# Patient Record
Sex: Female | Born: 1960
Health system: Southern US, Community
[De-identification: ages and names within clinical notes are randomized; demographics above are authoritative.]

## PROBLEM LIST (undated history)

## (undated) ENCOUNTER — Emergency Department (HOSPITAL_BASED_OUTPATIENT_CLINIC_OR_DEPARTMENT_OTHER): Admission: EM | Payer: 59 | Source: Home / Self Care

## (undated) DIAGNOSIS — K635 Polyp of colon: Secondary | ICD-10-CM

## (undated) DIAGNOSIS — E209 Hypoparathyroidism, unspecified: Secondary | ICD-10-CM

## (undated) DIAGNOSIS — Z8619 Personal history of other infectious and parasitic diseases: Secondary | ICD-10-CM

## (undated) DIAGNOSIS — J302 Other seasonal allergic rhinitis: Secondary | ICD-10-CM

## (undated) HISTORY — DX: Personal history of other infectious and parasitic diseases: Z86.19

## (undated) HISTORY — DX: Other seasonal allergic rhinitis: J30.2

## (undated) HISTORY — DX: Polyp of colon: K63.5

---

## 1994-12-26 HISTORY — PX: TOE SURGERY: SHX1073

## 2001-10-16 ENCOUNTER — Other Ambulatory Visit: Admission: RE | Admit: 2001-10-16 | Discharge: 2001-10-16 | Payer: Self-pay | Admitting: Obstetrics and Gynecology

## 2003-01-24 ENCOUNTER — Other Ambulatory Visit: Admission: RE | Admit: 2003-01-24 | Discharge: 2003-01-24 | Payer: Self-pay | Admitting: Obstetrics and Gynecology

## 2004-02-18 ENCOUNTER — Other Ambulatory Visit: Admission: RE | Admit: 2004-02-18 | Discharge: 2004-02-18 | Payer: Self-pay | Admitting: Obstetrics and Gynecology

## 2005-09-20 ENCOUNTER — Other Ambulatory Visit: Admission: RE | Admit: 2005-09-20 | Discharge: 2005-09-20 | Payer: Self-pay | Admitting: Obstetrics and Gynecology

## 2006-09-26 ENCOUNTER — Other Ambulatory Visit: Admission: RE | Admit: 2006-09-26 | Discharge: 2006-09-26 | Payer: Self-pay | Admitting: Obstetrics & Gynecology

## 2014-09-24 ENCOUNTER — Encounter: Payer: Self-pay | Admitting: Sports Medicine

## 2014-09-24 ENCOUNTER — Ambulatory Visit (INDEPENDENT_AMBULATORY_CARE_PROVIDER_SITE_OTHER): Payer: BC Managed Care – PPO | Admitting: Sports Medicine

## 2014-09-24 VITALS — BP 122/80 | Ht 67.0 in | Wt 175.0 lb

## 2014-09-24 DIAGNOSIS — M79675 Pain in left toe(s): Secondary | ICD-10-CM

## 2014-09-24 DIAGNOSIS — M79609 Pain in unspecified limb: Secondary | ICD-10-CM

## 2014-09-24 NOTE — Assessment & Plan Note (Signed)
We used a cut out pad to take pressure off the joint on her orthotics  She was given a bunion shield  In her dress shoe she will try a dancerr pad  The seemed to relieve her pain with walking  She will continue this strategy if she is doing well and we can replace pads as needed  She will return to see [place] if not getting adequate relief

## 2014-09-24 NOTE — Progress Notes (Signed)
Patient ID: Anita Barnett, female   DOB: 22-Nov-1961, 53 y.o.   MRN: [id number]  Patient likes to run and play tennis For the past several months she has had increased pain in her left first MTP joint This started after wearing high heels for a long event She does not remember a specific injury while playing tennis but has noticed some pain over the joint at times  She has seen a podiatrist who felt that she had some arthritis She was started on Mobic which improved her pain She was given custom orthotics which are pretty comfort but did not completely relieve the pain  No HX of gout  Physical examination No acute distress BP 122/80  Ht 5\' 7"  (1.702 m)  Wt 175 lb (79.379 kg)  BMI 27.40 kg/m2  Both feet have a moderate arch but with some collapse of the longitudinal arch She has a normal range of motion of the great toe bilaterally but it is slightly better on the right than the left Left MTP is tender over the dorsum and over the medial aspect of the joint 1 She has evidence of some loss of transverse arch with some increased callusing over the metatarsal pads but no tenderness  Ultrasound The left first MTP joint show some swelling over the dorsum but no significant spurring Along the medial border there is a calcification and narrowing of the joint Along the medial sesamoid there is some slight swelling and the sesamoid is bipartite

## 2014-11-05 ENCOUNTER — Encounter: Payer: Self-pay | Admitting: Nurse Practitioner

## 2015-01-15 ENCOUNTER — Encounter: Payer: Self-pay | Admitting: Nurse Practitioner

## 2015-01-15 ENCOUNTER — Ambulatory Visit (INDEPENDENT_AMBULATORY_CARE_PROVIDER_SITE_OTHER): Payer: BLUE CROSS/BLUE SHIELD | Admitting: Nurse Practitioner

## 2015-01-15 VITALS — BP 114/80 | HR 72 | Ht 66.5 in | Wt 192.0 lb

## 2015-01-15 DIAGNOSIS — Z0000 Encounter for general adult medical examination without abnormal findings: Secondary | ICD-10-CM

## 2015-01-15 DIAGNOSIS — Z23 Encounter for immunization: Secondary | ICD-10-CM

## 2015-01-15 DIAGNOSIS — Z01419 Encounter for gynecological examination (general) (routine) without abnormal findings: Secondary | ICD-10-CM

## 2015-01-15 MED ORDER — SOLIFENACIN SUCCINATE 5 MG PO TABS: 5.0000 mg | ORAL_TABLET | Freq: Every day | ORAL | Status: DC

## 2015-01-15 NOTE — Patient Instructions (Signed)

## 2015-01-15 NOTE — Progress Notes (Signed)
Patient ID: Anita Barnett, female   DOB: 03/25/1961, 54 y.o.   MRN: [id number] 54 y.o. [id number] Married  Caucasian Fe here for [place] annual exam. Previous pt. Dr. Lenda Kelp but over 3 years.   Some vaso symptoms that are tolerable.  Has gained 20 lbs since age 63.  Some urge incontinence that has become worse - would like treatment.  She is working on [employer] and doing things now for herself since children are in college.  continues to work part time.  Patient's last menstrual period was 12/26/2009 (approximate).          Sexually active: Yes.    The current method of family planning is post menopausal status.    Exercising: Yes.    Hot yoga in studio, trainer at gym twice weekly, some cardio, running and tennis at home Smoker:  no  Health Maintenance: Pap:  5 years ago, no history of abnormal pap MMG:  10/23/14, 3D, Bi-Rads 1:  Negative  Colonoscopy:  12/09/14, hyperplastic polyp, repeat in 10 years TDaP:  At least 10 years Labs:  Dr. Collene Mares and will have with PCP in 01/2015   reports that she has never smoked. She has never used smokeless tobacco. She reports that she drinks about 1.8 - 2.4 oz of alcohol per week. She reports that she does not use illicit drugs.  History reviewed. No pertinent past medical history.  Past Surgical History  Procedure Laterality Date  . Toe surgery Left 1996    pin in little toe    Current Outpatient Prescriptions  Medication Sig Dispense Refill  . Multiple Vitamin (MULTIVITAMIN) tablet Take 1 tablet by mouth daily. Centrum, Women over 50 formula     No current facility-administered medications for this visit.    Family History  Problem Relation Age of Onset  . COPD Mother     smoker  . Heart disease Father     CABG x 3  . Diabetes Maternal Grandfather     type 1    ROS:  Pertinent items are noted in HPI.  Otherwise, a comprehensive ROS was negative.  Exam:   BP 114/80 mmHg  Pulse 72  Ht 5' 6.5" (1.689 m)  Wt 192 lb (87.091 kg)  BMI 30.53 kg/m2   LMP 12/26/2009 (Approximate) Height: 5' 6.5" (168.9 cm) Ht Readings from Last 3 Encounters:  01/15/15 5' 6.5" (1.689 m)  09/24/14 5\' 7"  (1.702 m)    General appearance: alert, cooperative and appears stated age Head: Normocephalic, without obvious abnormality, atraumatic Neck: no adenopathy, supple, symmetrical, trachea midline and thyroid normal to inspection and palpation Lungs: clear to auscultation bilaterally Breasts: normal appearance, no masses or tenderness Heart: regular rate and rhythm Abdomen: soft, non-tender; no masses,  no organomegaly Extremities: extremities normal, atraumatic, no cyanosis or edema Skin: Skin color, texture, turgor normal. No rashes or lesions Lymph nodes: Cervical, supraclavicular, and axillary nodes normal. No abnormal inguinal nodes palpated Neurologic: Grossly normal   Pelvic: External genitalia:  no lesions              Urethra:  normal appearing urethra with no masses, tenderness or lesions              Bartholin's and Skene's: normal                 Vagina: normal appearing vagina with normal color and discharge, no lesions              Cervix: anteverted  Pap taken: Yes.   Bimanual Exam:  Uterus:  normal size, contour, position, consistency, mobility, non-tender              Adnexa: no mass, fullness, tenderness               Rectovaginal: Confirms               Anus:  normal sphincter tone, no lesions  Chaperone present: Yes  A:  Well Woman with normal exam  Postmenopausal - no HRT  Urge incontinence with weight gain  Update immunization  P:   Reviewed health and wellness pertinent to exam  Pap smear taken today  Mammogram is due 10/16  RX for Vesicare 5 mg daily - cautioned about potential side effects and constipation.  Will call back if not tolerated  TDaP given today  Counseled on breast self exam, mammography screening, adequate intake of calcium and vitamin D, diet and exercise, Kegel's exercises return annually  or prn  An After Visit Summary was printed and given to the patient.

## 2015-01-18 NOTE — Progress Notes (Signed)
Reviewed personally.  M. Suzanne Simcha Farrington, MD.  

## 2015-01-21 LAB — IPS PAP TEST WITH HPV

## 2015-01-22 NOTE — Addendum Note (Signed)
Addended by: Alfonzo Feller on: 01/22/2015 02:19 PM   Modules accepted: Miquel Dunn

## 2015-02-04 ENCOUNTER — Ambulatory Visit (INDEPENDENT_AMBULATORY_CARE_PROVIDER_SITE_OTHER): Payer: BLUE CROSS/BLUE SHIELD | Admitting: Internal Medicine

## 2015-02-04 ENCOUNTER — Other Ambulatory Visit: Payer: Self-pay | Admitting: Internal Medicine

## 2015-02-04 ENCOUNTER — Encounter: Payer: Self-pay | Admitting: Internal Medicine

## 2015-02-04 VITALS — BP 102/66 | Temp 98.1°F | Ht 66.0 in | Wt 192.9 lb

## 2015-02-04 DIAGNOSIS — Z23 Encounter for immunization: Secondary | ICD-10-CM

## 2015-02-04 DIAGNOSIS — R7989 Other specified abnormal findings of blood chemistry: Secondary | ICD-10-CM | POA: Insufficient documentation

## 2015-02-04 DIAGNOSIS — J3489 Other specified disorders of nose and nasal sinuses: Secondary | ICD-10-CM

## 2015-02-04 DIAGNOSIS — R945 Abnormal results of liver function studies: Secondary | ICD-10-CM | POA: Insufficient documentation

## 2015-02-04 HISTORY — DX: Other specified disorders of nose and nasal sinuses: J34.89

## 2015-02-04 HISTORY — DX: Hypercalcemia: E83.52

## 2015-02-04 HISTORY — DX: Other specified abnormal findings of blood chemistry: R79.89

## 2015-02-04 LAB — CBC WITH DIFFERENTIAL/PLATELET
BASOS PCT: 0.8 % (ref 0.0–3.0)
Basophils Absolute: 0.1 10*3/uL (ref 0.0–0.1)
EOS PCT: 2.4 % (ref 0.0–5.0)
Eosinophils Absolute: 0.2 10*3/uL (ref 0.0–0.7)
HCT: 38.5 % (ref 36.0–46.0)
Hemoglobin: 13 g/dL (ref 12.0–15.0)
LYMPHS ABS: 1.6 10*3/uL (ref 0.7–4.0)
Lymphocytes Relative: 23.8 % (ref 12.0–46.0)
MCHC: 33.9 g/dL (ref 30.0–36.0)
MCV: 90.1 fl (ref 78.0–100.0)
MONOS PCT: 7.6 % (ref 3.0–12.0)
Monocytes Absolute: 0.5 10*3/uL (ref 0.1–1.0)
NEUTROS ABS: 4.5 10*3/uL (ref 1.4–7.7)
Neutrophils Relative %: 65.4 % (ref 43.0–77.0)
Platelets: 356 10*3/uL (ref 150.0–400.0)
RBC: 4.27 Mil/uL (ref 3.87–5.11)
RDW: 13.3 % (ref 11.5–15.5)
WBC: 6.9 10*3/uL (ref 4.0–10.5)

## 2015-02-04 LAB — BASIC METABOLIC PANEL
BUN: 20 mg/dL (ref 6–23)
CALCIUM: 11.3 mg/dL — AB (ref 8.4–10.5)
CO2: 29 mEq/L (ref 19–32)
Chloride: 107 mEq/L (ref 96–112)
Creatinine, Ser: 0.78 mg/dL (ref 0.40–1.20)
GFR: 82.04 mL/min (ref >60.00)
Glucose, Bld: 105 mg/dL — ABNORMAL HIGH (ref 70–99)
POTASSIUM: 3.8 meq/L (ref 3.5–5.1)
SODIUM: 141 meq/L (ref 135–145)

## 2015-02-04 LAB — LIPID PANEL
Cholesterol: 148 mg/dL (ref 0–200)
HDL: 48.4 mg/dL (ref >39.00)
LDL CALC: 73 mg/dL (ref 0–99)
NonHDL: 99.6
TRIGLYCERIDES: 132 mg/dL (ref 0.0–149.0)
Total CHOL/HDL Ratio: 3
VLDL: 26.4 mg/dL (ref 0.0–40.0)

## 2015-02-04 LAB — TSH: TSH: 2.02 u[IU]/mL (ref 0.35–4.50)

## 2015-02-04 LAB — HEPATIC FUNCTION PANEL
ALBUMIN: 4.4 g/dL (ref 3.5–5.2)
ALT: 21 U/L (ref 0–35)
AST: 17 U/L (ref 0–37)
Alkaline Phosphatase: 128 U/L — ABNORMAL HIGH (ref 39–117)
Bilirubin, Direct: 0.1 mg/dL (ref 0.0–0.3)
Total Bilirubin: 0.4 mg/dL (ref 0.2–1.2)
Total Protein: 7.6 g/dL (ref 6.0–8.3)

## 2015-02-04 MED ORDER — MUPIROCIN 2 % EX OINT: 1.0000 "application " | TOPICAL_OINTMENT | Freq: Two times a day (BID) | CUTANEOUS | Status: DC

## 2015-02-04 NOTE — Progress Notes (Signed)
Pre visit review using our clinic review tool, if applicable. No additional management support is needed unless otherwise documented below in the visit note.  Chief Complaint  Patient presents with  . Establish Care    HPI: Patient  Anita Barnett  54 y.o.  Married CPS from  [place]  comes in today for New patient  Health Care visit  No recent Previous PCP or care  y  Just gyne and no real needed. However when had   Had blood tests  Reported A bit abnormal pre colonoscopy.  In fall 2015 .  Liver  Enzymes was abnormal  130  ?etoh or other fatty liver? Marland Kitchen  And then  Calcium elevated.   Some thirst .   And frequency .   Nov 21015  Decided needed PCP for fu.  Weight concerns  Since became 50  Has lost  5 #  Of the 15 from   lsi .  Dry nose :  Scab at times   nasal spray and  nettipot .   Nasal congestion vaseline   No fever no hx of same  Wont heal    Health Maintenance  Topic Date Due  . INFLUENZA VACCINE  07/27/2015  . MAMMOGRAM  10/23/2016  . PAP SMEAR  01/15/2018  . COLONOSCOPY  12/09/2024  . TETANUS/TDAP  01/15/2025   Health Maintenance Review LIFESTYLE:  Exercise:  4-5 x per week  Tobacco/ETS: Alcohol: per day ave 2-3 per week  Sugar beverages:no Sleep: 7 Drug use: no Bone density:  Colonoscopy:  2015 polyps 10 year fu dr Collene Mares PAP: utd  Jan 16  MAMMO: utd  Nov 15  lmp 2009   ROS:  GEN/ HEENT: No fever, significant weight changes sweats headaches vision problems hearing changes, CV/ PULM; No chest pain shortness of breath cough, syncope,edema  change in exercise tolerance. GI /GU: No adominal pain, vomiting, change in bowel habits. No blood in the stool. No significant GU symptoms. SKIN/HEME: ,no acute skin rashes suspicious lesions or bleeding. No lymphadenopathy, nodules, masses.  NEURO/ PSYCH:  No neurologic signs such as weakness numbness. No depression anxiety. IMM/ Allergy: No unusual infections.  Allergy .   REST of 12 system review negative except as per  HPI   Past Medical History  Diagnosis Date  . Seasonal allergies   . Colon polyps   . Hx of varicella   . History of shingles     Past Surgical History  Procedure Laterality Date  . Toe surgery Left 1996    pin in little toe    Family History  Problem Relation Age of Onset  . COPD Mother     smoker  . Heart disease Father     CABG x 3  . Diabetes Maternal Grandfather     type 1  parents : 70 capg in 21 s  Doing well.   Mom: copd 73 on o21 at night   History   Social History  . Marital Status: Married    Spouse Name: N/A  . Number of Children: 2  . Years of Education: N/A   Occupational History  . cpa    Social History Main Topics  . Smoking status: Never Smoker   . Smokeless tobacco: Never Used  . Alcohol Use: 1.2 - 1.8 oz/week    2-3 Cans of beer per week  . Drug Use: No  . Sexual Activity:    Partners: Male    Birth Control/ Protection: Post-menopausal   Other  Topics Concern  . 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    Lives with her husband.    Has 2 children in college.   G2P2   nneg ets fa    Outpatient Encounter Prescriptions as of 02/04/2015  Medication Sig  . Multiple Vitamin (MULTIVITAMIN) tablet Take 1 tablet by mouth daily. Centrum, Women over 50 formula  . mupirocin ointment (BACTROBAN) 2 % Place 1 application into the nose 2 (two) times daily.  . [DISCONTINUED] solifenacin (VESICARE) 5 MG tablet Take 1 tablet (5 mg total) by mouth daily. One po qd    EXAM:  BP 102/66 mmHg  Temp(Src) 98.1 F (36.7 C) (Oral)  Ht 5\' 6"  (1.676 m)  Wt 192 lb 14.4 oz (87.499 kg)  BMI 31.15 kg/m2  LMP 12/26/2009 (Approximate)  Body mass index is 31.15 kg/(m^2).  Physical Exam: Vital signs reviewed HER:DEYC is a well-developed well-nourished alert cooperative    who appearsr stated age in no acute distress.  HEENT: normocephalic atraumatic , Eyes: PERRL EOM's full,  conjunctiva clear, Nares: paten,t no deformity discharge or tenderness.,  Bilateral crevices red and yellow crusting  righ tmore than left and no lesions   NASAL culture  obtained Ears: no deformity EAC's clear TMs with normal landmarks. Mouth: clear OP, no lesions, edema.  Moist mucous membranes. Dentition in adequate repair. NECK: supple without masses, thyromegaly or bruits. CHEST/PULM:  Clear to auscultation and percussion breath sounds equal no wheeze , rales or rhonchi. No chest wall deformities or tenderness. CV: PMI is nondisplaced, S1 S2 no gallops, murmurs, rubs. Peripheral pulses are full without delay.  ABDOMEN: Bowel sounds normal nontender  No guard or rebound, no hepato splenomegal no CVA tenderness.   Extremtities:  No clubbing cyanosis or edema, no acute joint swelling or redness no focal atrophy NEURO:  Oriented x3, cranial nerves 3-12 appear to be intact, no obvious focal weakness,gait within normal limits no abnormal reflexes or asymmetrical SKIN: No acute rashes normal turgor, color, no bruising or petechiae. PSYCH: Oriented, good eye contact, no obvious depression anxiety, cognition and judgment appear normal. LN: no cervical  Adenopathy Med hx eval reviewed and scanned in  ASSESSMENT AND PLAN:  Discussed the following assessment and plan:  Nasal sore - poss bacterial bactroban may need antibiotic if not healting or as indicated - Plan: Wound culture  Abnormal LFTs - per dr Collene Mares.  repeat with hep c screen  - Plan: Hepatic function panel, Lipid panel, TSH, CBC with Differential/Platelet, Basic metabolic panel, PTH, intact and calcium, Hepatitis C antibody  Hypercalcemia - x 1 with polyuria dry mouth check pth and tsh  - Plan: Hepatic function panel, Lipid panel, TSH, CBC with Differential/Platelet, Basic metabolic panel, PTH, intact and calcium, Hepatitis C antibody Labs had pv at gyne jan sees dr Collene Mares who did blood tests  Patient Care Team: Burnis Medin, MD as PCP -  General (Internal Medicine) Milford Cage, FNP as Nurse Practitioner (Nurse Practitioner) Juanita Craver, MD as Consulting Physician (Gastroenterology) Lavonna Monarch, MD as Consulting Physician (Dermatology) Patient Instructions  Will notify you  of labs when available. Then plan follow up.  Begin antibiotic ointment  We may need to add pill antibiotic  Also pending Culture results . ROV if nasal crusting continues    .  If labs ok then wellness visit in months or as needed .  Continue lifestyle intervention healthy eating and exercise .  Healthy lifestyle includes : At least 150 minutes of exercise weeks  , weight at healthy levels, which is usually   BMI 19-25. Avoid trans fats and processed foods;  Increase fresh fruits and veges to 5 servings per day. And avoid sweet beverages including tea and juice. Mediterranean diet with olive oil and nuts have been noted to be heart and brain healthy . Avoid tobacco products . Limit  alcohol to  7 per week for women and 14 servings for men.  Get adequate sleep . Wear seat belts . Don't text and drive .      Standley Brooking. Panosh M.D.

## 2015-02-04 NOTE — Patient Instructions (Signed)
Will notify you  of labs when available. Then plan follow up.  Begin antibiotic ointment  We may need to add pill antibiotic  Also pending Culture results . ROV if nasal crusting continues    .  If labs ok then wellness visit in months or as needed .  Continue lifestyle intervention healthy eating and exercise .  Healthy lifestyle includes : At least 150 minutes of exercise weeks  , weight at healthy levels, which is usually   BMI 19-25. Avoid trans fats and processed foods;  Increase fresh fruits and veges to 5 servings per day. And avoid sweet beverages including tea and juice. Mediterranean diet with olive oil and nuts have been noted to be heart and brain healthy . Avoid tobacco products . Limit  alcohol to  7 per week for women and 14 servings for men.  Get adequate sleep . Wear seat belts . Don't text and drive .

## 2015-02-05 LAB — GAMMA GT: GGT: 14 U/L (ref 7–51)

## 2015-02-05 LAB — HEPATITIS C ANTIBODY: HCV AB: NEGATIVE

## 2015-02-05 LAB — PTH, INTACT AND CALCIUM
Calcium: 11 mg/dL — ABNORMAL HIGH (ref 8.4–10.5)
PTH: 91 pg/mL — ABNORMAL HIGH (ref 14–64)

## 2015-02-07 LAB — WOUND CULTURE
Gram Stain: NONE SEEN
Gram Stain: NONE SEEN
Gram Stain: NONE SEEN

## 2015-02-09 ENCOUNTER — Encounter: Payer: Self-pay | Admitting: Internal Medicine

## 2015-02-09 DIAGNOSIS — E213 Hyperparathyroidism, unspecified: Secondary | ICD-10-CM | POA: Insufficient documentation

## 2015-02-09 HISTORY — DX: Hyperparathyroidism, unspecified: E21.3

## 2015-02-10 ENCOUNTER — Other Ambulatory Visit: Payer: Self-pay | Admitting: Family Medicine

## 2015-02-10 ENCOUNTER — Telehealth: Payer: Self-pay | Admitting: Internal Medicine

## 2015-02-10 MED ORDER — CEPHALEXIN 500 MG PO CAPS: 500.0000 mg | ORAL_CAPSULE | Freq: Three times a day (TID) | ORAL | Status: DC

## 2015-02-10 NOTE — Telephone Encounter (Signed)
Order faxed to [facility]. 

## 2015-02-10 NOTE — Telephone Encounter (Signed)
Pt states she was advised to sch a bone density, pt did,at [facility].  But [facility] needs a referral /order and it needs to be faxed to [facility]. Fax:  [phone]

## 2015-02-23 ENCOUNTER — Telehealth: Payer: Self-pay | Admitting: Nurse Practitioner

## 2015-02-23 NOTE — Telephone Encounter (Signed)
Please let patient know that BMD done on 02/18/15 shows a T Score at the spine of -1.1, left hip neck at -1.5; right hip neck at -1.3.  The lowest is at the left hip neck and this falls in the low normal range.  The FRAX score for major fracture in 10 years is 5.8% (goal is <20%); FRAX score for hip fracture in 10 years is 0.5% (goal is < 3%).   No prior  comparison BMD.   While some bone loss is normal and expectant in postmenopausal women we do not want to see a further loss.  She needs to continue walking, aerobic, and core strengthening with upper body weights.

## 2015-02-25 NOTE — Telephone Encounter (Signed)
Results given to patient.  She voices understanding and is agreeable with plan.  Also states report went to PCP and she will wait to see if she gets a call from that office to see if she needs calcium given last blood work results. Pt appreciative of call.

## 2015-03-04 ENCOUNTER — Telehealth: Payer: Self-pay | Admitting: Internal Medicine

## 2015-03-04 NOTE — Telephone Encounter (Signed)
Pt would like a call to discuss her dexa results please.

## 2015-03-04 NOTE — Telephone Encounter (Signed)
Pt scheduled to discuss results on 03/25/15 @ 2:45

## 2015-03-25 ENCOUNTER — Encounter: Payer: Self-pay | Admitting: Internal Medicine

## 2015-03-25 ENCOUNTER — Ambulatory Visit (INDEPENDENT_AMBULATORY_CARE_PROVIDER_SITE_OTHER): Payer: BLUE CROSS/BLUE SHIELD | Admitting: Internal Medicine

## 2015-03-25 DIAGNOSIS — E349 Endocrine disorder, unspecified: Secondary | ICD-10-CM

## 2015-03-25 DIAGNOSIS — M8580 Other specified disorders of bone density and structure, unspecified site: Secondary | ICD-10-CM

## 2015-03-25 DIAGNOSIS — E213 Hyperparathyroidism, unspecified: Secondary | ICD-10-CM | POA: Diagnosis not present

## 2015-03-25 LAB — VITAMIN D 25 HYDROXY (VIT D DEFICIENCY, FRACTURES): VITD: 24.06 ng/mL — ABNORMAL LOW (ref 30.00–100.00)

## 2015-03-25 LAB — ALKALINE PHOSPHATASE: ALK PHOS: 121 U/L — AB (ref 39–117)

## 2015-03-25 LAB — PHOSPHORUS: Phosphorus: 2.6 mg/dL (ref 2.3–4.6)

## 2015-03-25 NOTE — Patient Instructions (Addendum)
Will notify you  of labs when available.   Poss primary hyperparathyroidism.  Parathyroid Hormone This is a test to determine whether PTH levels are responding normally to changes in blood calcium levels. It also helps to distinguish the cause of calcium imbalances, and to evaluate parathyroid function when calcium blood levels are higher or lower than normal, and when your caregiver may want to determine how well your parathyroid glands are working. Parathyroid hormone (PTH) helps the body maintain stable levels of calcium in the blood. It is part of a "feedback loop" that includes calcium, PTH, vitamin D, and, to some extent, phosphate and magnesium. Conditions and diseases that disrupt this feedback loop can cause inappropriate elevations or decreases in calcium and PTH levels and lead to symptoms of hypercalcemia (raised blood levels of calcium) or hypocalcemia (low blood levels of calcium).  PTH is produced by four parathyroid glands that are located in the neck beside the thyroid gland. Normally, these glands secrete PTH into the bloodstream in response to low blood calcium levels. Parathyroid hormone then works in three ways to help raise blood calcium levels back to normal. It takes calcium from the body's bone, stimulates the activation of vitamin D in the kidney (which in turn increases the absorption of calcium from the intestines), and suppresses the excretion of calcium in the urine (while encouraging excretion of phosphate). As calcium levels begin to increase in the blood, PTH normally decreases. PREPARATION FOR TEST You should have nothing to eat or drink except for water after midnight on the day of the test or as directed by your caregiver. A blood sample is obtained by inserting a needle into a vein in the arm. NORMAL FINDINGS Conventional Normal  PTH intact (whole).  Assay includes intact PTH.  Values (pg/mL) 10-65.  SI Units (ng/L) 10-65.  PTH N-terminal.  Values (pg/mL)  8-24.  SI Units (ng/L)8-24.  PTH C-terminal.  Assay includes C-terminal.  Values (pg/mL) 50-330.  SI Units (ng/L) 50-330.  Intact PTH.  Midmolecule. Ranges for normal findings may vary among different laboratories and hospitals. You should always check with your doctor after having lab work or other tests done to discuss the meaning of your test results and whether your values are considered within normal limits. MEANING OF TEST  Your caregiver will go over the test results with you and discuss the importance and meaning of your results, as well as treatment options and the need for additional tests if necessary. OBTAINING THE TEST RESULTS  It is your responsibility to obtain your test results. Ask the lab or department performing the test when and how you will get your results. Document Released: 01/14/2005 Document Revised: 04/28/2014 Document Reviewed: 11/23/2008 [facility] Patient Information 2015 [place], [place]. This information is not intended to replace advice given to you by your health care provider. Make sure you discuss any questions you have with your health care provider.

## 2015-03-25 NOTE — Progress Notes (Signed)
Pre visit review using our clinic review tool, if applicable. No additional management support is needed unless otherwise documented below in the visit note.  Chief Complaint  Patient presents with  . Follow-up    dexa and labs    HPI: Anita Barnett 55 y.o. fu  Anita Barnett of levated calcium  hypercalcemia and elevaetd pth level dexa scan . fels find no renal stones  fam hx of osteoporosis fracture .  Not really taking ca supplements . No depression fractures ROS: See pertinent positives and negatives per HPI.  Past Medical History  Diagnosis Date  . Seasonal allergies   . Colon polyps   . Hx of varicella   . History of shingles     Family History  Problem Relation Age of Onset  . COPD Mother     smoker  . Heart disease Father     CABG x 3  . Diabetes Maternal Grandfather     type 1   Mom 72 osteopenia no fx  History   Social History  . Marital Status: Married    Spouse Name: N/A  . Number of Children: 2  . Years of Education: N/A   Occupational History  . cpa    Social History Main Topics  . Smoking status: Never Smoker   . Smokeless tobacco: Never Used  . Alcohol Use: 1.2 - 1.8 oz/week    2-3 Cans of beer per week  . Drug Use: No  . Sexual Activity:    Partners: Male    Birth Control/ Protection: Post-menopausal   Other Topics Concern  . 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    Lives with her husband.    Has 2 children in college.   G2P2   nneg ets fa    Outpatient Encounter Prescriptions as of 03/25/2015  Medication Sig  . Multiple Vitamin (MULTIVITAMIN) tablet Take 1 tablet by mouth daily. Centrum, Women over 50 formula  . [DISCONTINUED] cephALEXin (KEFLEX) 500 MG capsule Take 1 capsule (500 mg total) by mouth 3 (three) times daily.  . [DISCONTINUED] mupirocin ointment (BACTROBAN) 2 % Place 1 application into the nose 2 (two) times daily.    EXAM:  BP 100/80 mmHg   Temp(Src) 98 F (36.7 C) (Oral)  Ht 5\' 6"  (1.676 m)  Wt 190 lb 12.8 oz (86.546 kg)  BMI 30.81 kg/m2  LMP 12/26/2009 (Approximate)  Body mass index is 30.81 kg/(m^2).  GENERAL: vitals reviewed and listed above, alert, oriented, appears well hydrated and in no acute distress HEENT: atraumatic, conjunctiva  clear, no obvious abnormalities on inspection of external nose and ears PSYCH: pleasant and cooperative, no obvious depression or anxiety Lab Results  Component Value Date   WBC 6.9 02/04/2015   HGB 13.0 02/04/2015   HCT 38.5 02/04/2015   PLT 356.0 02/04/2015   GLUCOSE 105* 02/04/2015   CHOL 148 02/04/2015   TRIG 132.0 02/04/2015   HDL 48.40 02/04/2015   LDLCALC 73 02/04/2015   ALT 21 02/04/2015   AST 17 02/04/2015   NA 141 02/04/2015   K 3.8 02/04/2015   CL 107 02/04/2015   CREATININE 0.78 02/04/2015   BUN 20 02/04/2015   CO2 29 02/04/2015   TSH 2.02 02/04/2015   Lab Results  Component Value Date   PTH 91* 02/04/2015   CALCIUM 11.3* 02/04/2015   CALCIUM 11.0* 02/04/2015  ASSESSMENT AND PLAN:  Discussed the following assessment and plan:  Hypercalcemia - Plan: PTH, intact and calcium, Vit D  25 hydroxy (rtn osteoporosis monitoring), Alkaline phosphatase, Phosphorus  Osteopenia - -1.5 hip  - Plan: PTH, intact and calcium, Vit D  25 hydroxy (rtn osteoporosis monitoring), Alkaline phosphatase, Phosphorus  Hyperparathyroidism  Elevated parathyroid hormone - Plan: PTH, intact and calcium, Vit D  25 hydroxy (rtn osteoporosis monitoring), Alkaline phosphatase, Phosphorus Plan repeat pth and ca cium vit d level   reeval  as appropriate consider endo consult  Vs surgery referral .  Only sx is  Poss osteopenia -1.5 fo rage. -Patient advised to return or notify health care team  if symptoms worsen ,persist or new concerns arise. HO on upto date on hpt Total visit 56mins > 50% spent counseling and coordinating care   Patient Instructions  Will notify you  of labs when  available.   Poss primary hyperparathyroidism.  Parathyroid Hormone This is a test to determine whether PTH levels are responding normally to changes in blood calcium levels. It also helps to distinguish the cause of calcium imbalances, and to evaluate parathyroid function when calcium blood levels are higher or lower than normal, and when your caregiver may want to determine how well your parathyroid glands are working. Parathyroid hormone (PTH) helps the body maintain stable levels of calcium in the blood. It is part of a "feedback loop" that includes calcium, PTH, vitamin D, and, to some extent, phosphate and magnesium. Conditions and diseases that disrupt this feedback loop can cause inappropriate elevations or decreases in calcium and PTH levels and lead to symptoms of hypercalcemia (raised blood levels of calcium) or hypocalcemia (low blood levels of calcium).  PTH is produced by four parathyroid glands that are located in the neck beside the thyroid gland. Normally, these glands secrete PTH into the bloodstream in response to low blood calcium levels. Parathyroid hormone then works in three ways to help raise blood calcium levels back to normal. It takes calcium from the body's bone, stimulates the activation of vitamin D in the kidney (which in turn increases the absorption of calcium from the intestines), and suppresses the excretion of calcium in the urine (while encouraging excretion of phosphate). As calcium levels begin to increase in the blood, PTH normally decreases. PREPARATION FOR TEST You should have nothing to eat or drink except for water after midnight on the day of the test or as directed by your caregiver. A blood sample is obtained by inserting a needle into a vein in the arm. NORMAL FINDINGS Conventional Normal  PTH intact (whole).  Assay includes intact PTH.  Values (pg/mL) 10-65.  SI Units (ng/L) 10-65.  PTH N-terminal.  Values (pg/mL) 8-24.  SI Units  (ng/L)8-24.  PTH C-terminal.  Assay includes C-terminal.  Values (pg/mL) 50-330.  SI Units (ng/L) 50-330.  Intact PTH.  Midmolecule. Ranges for normal findings may vary among different laboratories and hospitals. You should always check with your doctor after having lab work or other tests done to discuss the meaning of your test results and whether your values are considered within normal limits. MEANING OF TEST  Your caregiver will go over the test results with you and discuss the importance and meaning of your results, as well as treatment options and the need for additional tests if necessary. OBTAINING THE TEST RESULTS  It is your responsibility to obtain your test results. Ask the lab or department performing the test when and how you will get  your results. Document Released: 01/14/2005 Document Revised: 04/28/2014 Document Reviewed: 11/23/2008 [facility] Patient Information 2015 [place], [place]. This information is not intended to replace advice given to you by your health care provider. Make sure you discuss any questions you have with your health care provider.      Standley Brooking. Danaysha Kirn M.D.

## 2015-03-27 LAB — PTH, INTACT AND CALCIUM
Calcium: 10.8 mg/dL — ABNORMAL HIGH (ref 8.4–10.5)
PTH: 104 pg/mL — ABNORMAL HIGH (ref 14–64)

## 2015-04-06 ENCOUNTER — Other Ambulatory Visit: Payer: Self-pay | Admitting: Family Medicine

## 2015-04-06 ENCOUNTER — Telehealth: Payer: Self-pay | Admitting: Internal Medicine

## 2015-04-06 DIAGNOSIS — R7989 Other specified abnormal findings of blood chemistry: Secondary | ICD-10-CM

## 2015-04-06 DIAGNOSIS — E213 Hyperparathyroidism, unspecified: Secondary | ICD-10-CM

## 2015-04-06 NOTE — Telephone Encounter (Signed)
Pt would like you to call them concerning some labs 2  Weeks ago.

## 2015-04-06 NOTE — Telephone Encounter (Signed)
Pt notified of lab work results.  See result note.

## 2015-04-08 ENCOUNTER — Encounter: Payer: Self-pay | Admitting: Internal Medicine

## 2015-06-11 ENCOUNTER — Encounter: Payer: Self-pay | Admitting: Internal Medicine

## 2015-06-11 ENCOUNTER — Ambulatory Visit (INDEPENDENT_AMBULATORY_CARE_PROVIDER_SITE_OTHER): Payer: BLUE CROSS/BLUE SHIELD | Admitting: Internal Medicine

## 2015-06-11 DIAGNOSIS — M8580 Other specified disorders of bone density and structure, unspecified site: Secondary | ICD-10-CM | POA: Diagnosis not present

## 2015-06-11 DIAGNOSIS — E213 Hyperparathyroidism, unspecified: Secondary | ICD-10-CM

## 2015-06-11 DIAGNOSIS — R7989 Other specified abnormal findings of blood chemistry: Secondary | ICD-10-CM

## 2015-06-11 LAB — VITAMIN D 25 HYDROXY (VIT D DEFICIENCY, FRACTURES): VITD: 25.84 ng/mL — ABNORMAL LOW (ref 30.00–100.00)

## 2015-06-11 NOTE — Progress Notes (Signed)
Pre visit review using our clinic review tool, if applicable. No additional management support is needed unless otherwise documented below in the visit note.  Chief Complaint  Patient presents with  . Follow-up    HPI: Anita Barnett 54 y.o. comes in today after being on vitamin D hasn't had her labs done yet she has had elevated PTH and calcium levels low vitamin D and an elevated alkaline phosphatase. Her last PTH was 104 calcium 10.8. See lab and interp         Ref Range 42mo ago (03/25/15) 48mo ago (02/04/15) 78mo ago (02/04/15)    PTH 14 - 64 pg/mL 104 (H) 91 (H)     Calcium 8.4 - 10.5 mg/dL 10.8 (H) 11.0 (H)CM 11.3 (H)   Comments:          Has lost weight in a healthy manner denies renal stones severe fatigue bloating or hypertension. She does have osteopenia.  ROS: See pertinent positives and negatives per HPI.  Past Medical History  Diagnosis Date  . Seasonal allergies   . Colon polyps   . Hx of varicella   . History of shingles     Family History  Problem Relation Age of Onset  . COPD Mother     smoker  . Heart disease Father     CABG x 3  . Diabetes Maternal Grandfather     type 1    History   Social History  . Marital Status: Married    Spouse Name: N/A  . Number of Children: 2  . Years of Education: N/A   Occupational History  . cpa    Social History Main Topics  . Smoking status: Never Smoker   . Smokeless tobacco: Never Used  . Alcohol Use: 1.2 - 1.8 oz/week    2-3 Cans of beer per week  . Drug Use: No  . Sexual Activity:    Partners: Male    Birth Control/ Protection: Post-menopausal   Other Topics Concern  . 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    Lives with her husband.    Has 2 children in college.   G2P2   nneg ets fa    Outpatient Prescriptions Prior to Visit  Medication Sig Dispense Refill  . Multiple Vitamin (MULTIVITAMIN) tablet Take  1 tablet by mouth daily. Centrum, Women over 50 formula     No facility-administered medications prior to visit.     EXAM:  BP 106/78 mmHg  Temp(Src) 98.3 F (36.8 C) (Oral)  Ht 5\' 6"  (1.676 m)  Wt 182 lb 8 oz (82.781 kg)  BMI 29.47 kg/m2  LMP 12/26/2009 (Approximate)  Body mass index is 29.47 kg/(m^2).  GENERAL: vitals reviewed and listed above, alert, oriented, appears well hydrated and in no acute distress HEENT: atraumatic, conjunctiva  clear, no obvious abnormalities on inspection of external nose and earsPSYCH: pleasant and cooperative, no obvious depression or anxiety Lab Results  Component Value Date   WBC 6.9 02/04/2015   HGB 13.0 02/04/2015   HCT 38.5 02/04/2015   PLT 356.0 02/04/2015   GLUCOSE 105* 02/04/2015   CHOL 148 02/04/2015   TRIG 132.0 02/04/2015   HDL 48.40 02/04/2015   LDLCALC 73 02/04/2015   ALT 21 02/04/2015   AST 17 02/04/2015   NA 141 02/04/2015   K 3.8 02/04/2015   CL 107 02/04/2015  CREATININE 0.78 02/04/2015   BUN 20 02/04/2015   CO2 29 02/04/2015   TSH 2.02 02/04/2015   BP Readings from Last 3 Encounters:  06/11/15 106/78  03/25/15 100/80  02/04/15 102/66   Wt Readings from Last 3 Encounters:  06/11/15 182 lb 8 oz (82.781 kg)  03/25/15 190 lb 12.8 oz (86.546 kg)  02/04/15 192 lb 14.4 oz (87.499 kg)   laboratory studies from last visit reviewed. ASSESSMENT AND PLAN:  Discussed the following assessment and plan:  Hypercalcemia - Plan: Vit D  25 hydroxy (rtn osteoporosis monitoring), Parathyroid hormone, intact (no Ca), Calcium, Urine, 24 Hour  Hyperparathyroidism - Plan: Vit D  25 hydroxy (rtn osteoporosis monitoring), Parathyroid hormone, intact (no Ca), Calcium, Urine, 24 Hour  Osteopenia  Low serum vitamin D - Plan: Vit D  25 hydroxy (rtn osteoporosis monitoring), Parathyroid hormone, intact (no Ca), Calcium, Urine, 24 Hour Probably primary hyper parathyroidism discussed expectant management and workup.   Patient  Instructions  Labs today  Will notify you  of labs when available. And suspect we will be referring  You to dr Harlow Asa for evaluation of hyperparathyroidism.      Standley Brooking. Pascale Maves M.D.

## 2015-06-11 NOTE — Patient Instructions (Signed)
Labs today  Will notify you  of labs when available. And suspect we will be referring  You to dr Harlow Asa for evaluation of hyperparathyroidism.

## 2015-06-12 LAB — PARATHYROID HORMONE, INTACT (NO CA): PTH: 117 pg/mL — ABNORMAL HIGH (ref 14–64)

## 2015-06-16 ENCOUNTER — Other Ambulatory Visit: Payer: Self-pay | Admitting: Internal Medicine

## 2015-06-17 LAB — CALCIUM, URINE, 24 HOUR
CALCIUM UR: 24 mg/dL
Calcium, 24 hour urine: 432 mg/d — ABNORMAL HIGH (ref 100–250)

## 2015-06-22 ENCOUNTER — Other Ambulatory Visit: Payer: Self-pay | Admitting: Family Medicine

## 2015-06-22 DIAGNOSIS — E213 Hyperparathyroidism, unspecified: Secondary | ICD-10-CM

## 2015-08-10 ENCOUNTER — Ambulatory Visit (INDEPENDENT_AMBULATORY_CARE_PROVIDER_SITE_OTHER): Payer: BLUE CROSS/BLUE SHIELD | Admitting: Internal Medicine

## 2015-08-10 ENCOUNTER — Encounter: Payer: Self-pay | Admitting: Internal Medicine

## 2015-08-10 ENCOUNTER — Other Ambulatory Visit (INDEPENDENT_AMBULATORY_CARE_PROVIDER_SITE_OTHER): Payer: BLUE CROSS/BLUE SHIELD

## 2015-08-10 VITALS — HR 60 | Temp 97.9°F | Resp 14 | Ht 67.0 in | Wt 186.0 lb

## 2015-08-10 DIAGNOSIS — E213 Hyperparathyroidism, unspecified: Secondary | ICD-10-CM | POA: Diagnosis not present

## 2015-08-10 DIAGNOSIS — E559 Vitamin D deficiency, unspecified: Secondary | ICD-10-CM

## 2015-08-10 LAB — VITAMIN D 25 HYDROXY (VIT D DEFICIENCY, FRACTURES): VITD: 23.74 ng/mL — AB (ref 30.00–100.00)

## 2015-08-10 LAB — MAGNESIUM: Magnesium: 2 mg/dL (ref 1.5–2.5)

## 2015-08-10 NOTE — Progress Notes (Signed)
Anita Barnett ID: ANNAKATE SOULIER, female   DOB: 1961/05/22, 54 y.o.   MRN: [id number]   HPI  Anita Barnett is a 54 y.o.-year-old female, referred by Anita Barnett PCP, Dr. Regis Bill, for evaluation for hypercalcemia/hyperparathyroidism.  Anita Barnett was dx with hypercalcemia in 11/2014. I reviewed Anita Barnett's pertinent labs: Lab Results  Component Value Date   PTH 117* 06/11/2015   PTH 104* 03/25/2015   PTH 91* 02/04/2015   CALCIUM 10.8* 03/25/2015   CALCIUM 11.3* 02/04/2015   CALCIUM 11.0* 02/04/2015   24 h Urine calcium - no urine creatinine checked: Component     Latest Ref Rng 06/16/2015  Calcium, 24 hour urine     100 - 250 mg/day 432 (H)   I reviewed Anita Barnett's DEXA scans - Anita Barnett has osteopenia: Date L1-L4 T score FN T score FRAX  02/18/2015 -1.1 LFN: -1.5 RFN: -1.3 MOF: 5.8% Hip fx risk 0.5%   + fracture - teenager and also a fractured 5th toe 20 years ago.  No h/o kidney stones.  No h/o CKD. Last BUN/Cr: Lab Results  Component Value Date   BUN 20 02/04/2015   CREATININE 0.78 02/04/2015   Anita Barnett is not on HCTZ.  Anita Barnett has a  h/o vitamin D deficiency. Last 2x vit D levels were - second check on vit D (500 units): Component     Latest Ref Rng 03/25/2015 06/11/2015  VITD     30.00 - 100.00 ng/mL 24.06 (L) 25.84 (L)   Anita Barnett is not on calcium and vitamin D; Anita Barnett also eats dairy and green, leafy, vegetables (spinach, broccoli).  Anita Barnett lost 10 lbs this Spring - maintained.  Other pertinent labs: Component     Latest Ref Rng 02/04/2015 03/25/2015  TSH     0.35 - 4.50 uIU/mL 2.02   Alkaline Phosphatase     39 - 117 U/L  121 (H)  Phosphorus     2.3 - 4.6 mg/dL  2.6   Anita Barnett exercises 5-7x a week: tennis, lifting weights, yoga, walking/jogging.  Meals: - Breakfast: Yogurt with blueberries - Lunch: Varies, sandwich or salad or soup - Dinner: Varies, chicken with vegetables - Snacks: Fruit, cheese crackers 1-2 times a day  Anita Barnett does not have a FH of hypercalcemia, pituitary tumors, thyroid cancer, or osteoporosis. +  kidney stones in father. Mother with osteopenia.   ROS: Constitutional: no weight gain/loss, no fatigue, no subjective hyperthermia/hypothermia, nocturia+  Eyes: no blurry vision, no xerophthalmia ENT: no sore throat, no nodules palpated in throat, no dysphagia/odynophagia, no hoarseness Cardiovascular: no CP/SOB/palpitations/leg swelling Respiratory: no cough/SOB Gastrointestinal: no N/V/D/C Musculoskeletal: no muscle/joint aches Skin: no rashes Neurological: no tremors/numbness/tingling/dizziness Psychiatric: no depression/anxiety  Past Medical History  Diagnosis Date  . Seasonal allergies   . Colon polyps   . Hx of varicella   . History of shingles    Past Surgical History  Procedure Laterality Date  . Toe surgery Left 1996    pin in little toe   Social History   Social History  . Marital Status: Married    Spouse Name: N/A  . Number of Children: 2  . Years of Education: N/A   Occupational History  . cpa    Social History Main Topics  . Smoking status: Never Smoker   . Smokeless tobacco: Never Used  . Alcohol Use: 1.2 - 1.8 oz/week    2-3 Cans of beer per week  . Drug Use: No  . Sexual Activity:    Partners: Male    Birth Control/ Protection: Post-menopausal  Other Topics Concern  . 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    Lives with Anita Barnett husband.    Has 2 children in college.   G2P2   nneg ets fa   Current Outpatient Prescriptions on File Prior to Visit  Medication Sig Dispense Refill  . Cholecalciferol (VITAMIN D PO) Take 500 Units by mouth daily.    . Multiple Vitamin (MULTIVITAMIN) tablet Take 1 tablet by mouth daily. Centrum, Women over 50 formula     No current facility-administered medications on file prior to visit.   No Known Allergies Family History  Problem Relation Age of Onset  . COPD Mother     smoker  . Heart disease Father     CABG x 3  .  Diabetes Maternal Grandfather     type 1   PE: Pulse 60  Temp(Src) 97.9 F (36.6 C) (Oral)  Resp 14  Ht 5\' 7"  (1.702 m)  Wt 186 lb (84.369 kg)  BMI 29.12 kg/m2  SpO2 95%  LMP 12/26/2009 (Approximate) Wt Readings from Last 3 Encounters:  08/10/15 186 lb (84.369 kg)  06/11/15 182 lb 8 oz (82.781 kg)  03/25/15 190 lb 12.8 oz (86.546 kg)   Constitutional: overweight, in NAD. No kyphosis. Appears tanned. Eyes: PERRLA, EOMI, no exophthalmos ENT: moist mucous membranes, no thyromegaly, no cervical lymphadenopathy Cardiovascular: RRR, No MRG Respiratory: CTA B Gastrointestinal: abdomen soft, NT, ND, BS+ Musculoskeletal: no deformities, strength intact in all 4 Skin: moist, warm, no rashes Neurological: no tremor with outstretched hands, DTR normal in all 4  Assessment: 1. Hypercalcemia/hyperparathyroidism  2. Vitamin D deficiency  Plan: 1. And 2. Anita Barnett has had several instances of elevated calcium, with the highest level being at 11.3. A corresponding intact PTH level was also high, at 104, and, more recently, 107  - Anita Barnett also  has vitamin D deficiency, with the latest level being 25.  - No apparent complications from hypercalcemia: no h/o nephrolithiasis, no osteoporosis, no fractures. No abdominal pain (other than related to diverticulitis/gastritis), depression, bone pain. - I discussed with the Anita Barnett about the physiology of calcium and parathyroid hormone, and possible side effects from increased PTH, including kidney stones, osteoporosis, abdominal pain, etc.  - We discussed that we need to check whether Anita Barnett hyperparathyroidism is primary (Familial hypercalcemic hypocalciuria or parathyroid adenoma) or secondary (to conditions like: vitamin D deficiency, calcium malabsorption, hypercalciuria, renal insufficiency, etc.). - I discussed with Anita Barnett that we first need to bring Anita Barnett vitamin D level to normal so we can further investigate the parathyroid status. I explained that in  the setting of a low vitamin D, the parathyroid hormone can be elevated, which is not a pathologic finding. However, if the PTH is elevated in the setting of a normal vitamin D, we will further need to investigate Anita Barnett for primary or secondary hyperparathyroidism.  - Since Anita Barnett vitamin D was low to normal range, we decided to repeat this today, and add : calcium level intact PTH  Magnesium vitamin D- 25 HO and 1,25 HO I would not repeat Anita Barnett urine collection, since Anita Barnett calcium was high, and Anita Barnett mentions that had more than ~1.5L urine in the jug. Anita Barnett described how Anita Barnett did the collection this appears correct. - We discussed possible consequences of hyperparathyroidism: ~1/3 pts will develop complications over 15 years (OP, nephrolithiasis).  - If the  tests indicate a parathyroid adenoma, Anita Barnett agrees with a referral to surgery.  - we discussed about criteria for parathyroid surgery:  Increased calcium by more than 1 mg/dL above the upper limit of normal  Kidney ds.  Osteoporosis (or Vb fx) Age <27 years old New (2013): High UCa >400 mg/d and increased stone risk by biochemical stone risk analysis Presence of nephrolithiasis or nephrocalcinosis Anita Barnett's preference!  - I advised the Anita Barnett to start 1000 units of vitamin D from the diet. I will advise Anita Barnett about vitamin D supplement dose when the results of the vitamin D level are back. - I will see the Anita Barnett back in 4 months  - time spent with the Anita Barnett: 1 hour, of which >50% was spent in obtaining information about herhypercalcemia/hyperparathyroidism, reviewing Anita Barnett previous labs, evaluations, and treatments, counseling Anita Barnett about Anita Barnett condition (please see the discussed topics above), and developing a plan to further investigate it; Anita Barnett had a number of questions which I addressed.  Component     Latest Ref Rng 08/10/2015          Vitamin D 1, 25 (OH) Total     18 - 72 pg/mL 102 (H)  Vitamin D3 1, 25 (OH)      102  Vitamin D2 1, 25 (OH)      <8   PTH     14 - 64 pg/mL 65 (H)  Calcium     8.4 - 10.5 mg/dL 10.9 (H)  VITD     30.00 - 100.00 ng/mL 23.74 (L)  Magnesium     1.5 - 2.5 mg/dL 2.0  Vitamin D is low, I will advise Anita Barnett to increase Anita Barnett vitamin D dose to 2000 units daily. The rest of the labs point towards primary hyperparathyroidism. I will refer to surgery.

## 2015-08-10 NOTE — Patient Instructions (Signed)
Please stop at the lab at [facility] office.  Please come back for a follow-up appointment in 4 months.  .Parathyroid Hormone This is a test to determine whether PTH levels are responding normally to changes in blood calcium levels. It also helps to distinguish the cause of calcium imbalances, and to evaluate parathyroid function when calcium blood levels are higher or lower than normal, and when your caregiver may want to determine how well your parathyroid glands are working. Parathyroid hormone (PTH) helps the body maintain stable levels of calcium in the blood. It is part of a "feedback loop" that includes calcium, PTH, vitamin D, and, to some extent, phosphate and magnesium. Conditions and diseases that disrupt this feedback loop can cause inappropriate elevations or decreases in calcium and PTH levels and lead to symptoms of hypercalcemia (raised blood levels of calcium) or hypocalcemia (low blood levels of calcium).  PTH is produced by four parathyroid glands that are located in the neck beside the thyroid gland. Normally, these glands secrete PTH into the bloodstream in response to low blood calcium levels. Parathyroid hormone then works in three ways to help raise blood calcium levels back to normal. It takes calcium from the body's bone, stimulates the activation of vitamin D in the kidney (which in turn increases the absorption of calcium from the intestines), and suppresses the excretion of calcium in the urine (while encouraging excretion of phosphate). As calcium levels begin to increase in the blood, PTH normally decreases. PREPARATION FOR TEST You should have nothing to eat or drink except for water after midnight on the day of the test or as directed by your caregiver. A blood sample is obtained by inserting a needle into a vein in the arm. NORMAL FINDINGS Conventional Normal  PTH intact (whole).  Assay includes intact PTH.  Values (pg/mL) 10-65.  SI Units (ng/L) 10-65.  PTH  N-terminal.  Values (pg/mL) 8-24.  SI Units (ng/L)8-24.  PTH C-terminal.  Assay includes C-terminal.  Values (pg/mL) 50-330.  SI Units (ng/L) 50-330.  Intact PTH.  Midmolecule. Ranges for normal findings may vary among different laboratories and hospitals. You should always check with your doctor after having lab work or other tests done to discuss the meaning of your test results and whether your values are considered within normal limits. MEANING OF TEST  Your caregiver will go over the test results with you and discuss the importance and meaning of your results, as well as treatment options and the need for additional tests if necessary. OBTAINING THE TEST RESULTS  It is your responsibility to obtain your test results. Ask the lab or department performing the test when and how you will get your results. Document Released: 01/14/2005 Document Revised: 04/28/2014 Document Reviewed: 11/23/2008 [facility] Patient Information 2015 [place], [place]. This information is not intended to replace advice given to you by your health care provider. Make sure you discuss any questions you have with your health care provider.  Parathyroidectomy A parathyroidectomy is surgery to remove one or more parathyroid glands. These glands produce a hormone (parathyroid hormone) that helps control the level of calcium in your body. The glands are very small, about the size of a pea. They are located in your neck, close to your thyroid gland and your Adam's apple. Most people (85%) have four parathyroid glands,some people may have one or two more than that. Hyperparathyroidism is when too much parathyroid hormone is being produced. Usually this is caused by one of the parathyroid glands becoming enlarged, but  it can also be caused by more than one of the glands. Hyperparathyroidism is found during blood tests that show high calcium in the blood. Parathyroid hormone levels will also be elevated. Cancer also can  cause hyperparathyroidism, but this is rare. For the most common type of hyperparathyroidism, the treatment is surgical removal of the parathyroid gland that is enlarged. For patients with kidney failure and hyperparathyroidism, other treatment will be tried before surgery is done on the parathyroid.  Many times x-ray studies are done to find out which parathyroid gland or glands is malfunctioning. The decision about the best treatment for hyperparathyroidism is between the patient, their primary doctor, an endocrinologist, and a surgeon experienced in parathyroid surgery. LET YOUR CAREGIVER KNOW ABOUT:  Any allergies.  All medications you are taking, including:  Herbs, eyedrops, over-the-counter medications and creams.  Blood thinners (anticoagulants), aspirin or other drugs that could affect blood clotting.  Use of steroids (by mouth or as creams).  Previous problems with anesthetics, including local anesthetics.  Possibility of pregnancy, if this applies.  Any history of blood clots.  Any history of bleeding or other blood problems.  Previous surgery.  Smoking history.  Other health problems. RISKS AND COMPLICATIONS   Short-term possibilities include:  Excessive bleeding.  Pain.  Infection near the incision.  Slow healing.  Pooling of blood under the wound (hematoma).  Damage to nerves in your neck.  Blood clots.  Difficulty breathing. This is very rare. It also is almost always temporary.  Longer-term possibilities include:  Scarring.  Skin damage.  Damage to blood vessels in the area.  Need for additional surgery.  A hoarse or weak voice. This is usually temporary. It can be the result of nerve damage.  Development of hypoparathyroidism. This means you are not making enough parathyroid hormone. It is rare. If it occurs, you will need to take calcium supplements daily. BEFORE THE PROCEDURE  Sometimes the surgery is done on an outpatient basis. This  means you could go home the same day as your surgery. Other times, people need to stay in the hospital overnight. Ask your surgeon what you should expect.  If your surgery will be an outpatient procedure, arrange for someone to drive you home after the surgery.  Two weeks before your surgery, stop using aspirin and non-steroidal anti-inflammatory drugs (NSAID's) for pain relief. This includes prescription drugs and over-the-counter drugs such as ibuprofen and naproxen. Also stop taking vitamin E.  If you take blood-thinners, ask your healthcare provider when you should stop taking them.  Do not eat or drink for about 8 hours before your surgery.  You might be asked to shower or wash with a special antibacterial soap before the procedure.  Arrive at least an hour before the surgery, or whenever your surgeon recommends. This will give you time to check in and fill out any needed paperwork. PROCEDURE  The preparation:  You will change into a hospital gown.  You will be given an IV. A needle will be inserted in your arm. Medication will be able to flow directly into your body through this needle.  You might be given a sedative to help you relax.  You will be given a drug that puts you to sleep during the surgery (general anesthetic).  The procedure:  Once you are asleep, the surgeon will make a small cut (incision) in your lower neck. Ask your surgeon where the incision will be.  The surgeon will look for the gland(s) that are not  working well. Often a tissue sample from a gland is used to determine this.  Any glands that are not working well will be removed.  The surgeon will close the incision with stitches, often these are hidden under the skin. AFTER THE PROCEDURE  You will stay in a recovery area until the anesthesia has worn off. Your blood pressure and heart rate will be checked.  If your surgery was an outpatient procedure, you will go home the same day.  If you need to  stay in the hospital, you will be moved to a hospital room. You will probably stay for two to three days. This will depend on how quickly you recover.  While you are in the hospital, your blood will be tested to check the calcium levels in your body. HOME CARE INSTRUCTIONS   Take any medication that your surgeon prescribes. Follow the directions carefully. Take all of the medication.  Ask your surgeon whether you can take over-the-counter medicines for pain, discomfort or fever. Do not take aspirin without permission from the surgeon. Aspirin increases the chances of bleeding.  Do not get the wound wet for the first few days after surgery (or until the surgeon tells you it is OK).  After this procedure, many patients may develop low calcium levels in the blood. It is critical that you see your medical caregiver to have this monitored and managed.     SEEK MEDICAL CARE IF:   You notice blood or fluid leaking from the wound, or it becomes red or swollen.  You have trouble breathing.  You have trouble speaking.  You become nauseous or throw up for more than two days after the surgery.  You have a fever or persistent symptoms for more than 2-3 days. SEEK IMMEDIATE MEDICAL CARE IF:   Breathing becomes more difficult.  You have a fever and your symptoms suddenly get worse. Document Released: 03/10/2009 Document Revised: 11/28/2012 Document Reviewed: 03/10/2009 [facility] Patient Information 2015 [place], [place]. This information is not intended to replace advice given to you by your health care provider. Make sure you discuss any questions you have with your health care provider.

## 2015-08-11 LAB — PTH, INTACT AND CALCIUM
CALCIUM: 10.9 mg/dL — AB (ref 8.4–10.5)
PTH: 65 pg/mL — ABNORMAL HIGH (ref 14–64)

## 2015-08-13 LAB — VITAMIN D 1,25 DIHYDROXY
VITAMIN D 1, 25 (OH) TOTAL: 102 pg/mL — AB (ref 18–72)
VITAMIN D3 1, 25 (OH): 102 pg/mL
Vitamin D2 1, 25 (OH)2: <8 pg/mL

## 2015-08-14 ENCOUNTER — Other Ambulatory Visit: Payer: Self-pay | Admitting: Internal Medicine

## 2015-08-14 DIAGNOSIS — E213 Hyperparathyroidism, unspecified: Secondary | ICD-10-CM

## 2015-09-03 ENCOUNTER — Other Ambulatory Visit: Payer: Self-pay | Admitting: Surgery

## 2015-09-03 DIAGNOSIS — E213 Hyperparathyroidism, unspecified: Secondary | ICD-10-CM

## 2015-09-10 ENCOUNTER — Ambulatory Visit (HOSPITAL_COMMUNITY): Payer: BLUE CROSS/BLUE SHIELD

## 2015-09-17 ENCOUNTER — Encounter (HOSPITAL_COMMUNITY): Payer: BLUE CROSS/BLUE SHIELD

## 2015-09-17 ENCOUNTER — Ambulatory Visit (HOSPITAL_COMMUNITY): Payer: BLUE CROSS/BLUE SHIELD

## 2015-09-21 ENCOUNTER — Encounter (HOSPITAL_COMMUNITY)
Admission: RE | Admit: 2015-09-21 | Discharge: 2015-09-21 | Disposition: A | Discharge status: Home / Self Care | Payer: BLUE CROSS/BLUE SHIELD | Source: Ambulatory Visit | Attending: Surgery | Admitting: Surgery

## 2015-09-21 DIAGNOSIS — E213 Hyperparathyroidism, unspecified: Secondary | ICD-10-CM | POA: Diagnosis present

## 2015-09-21 MED ORDER — TECHNETIUM TC 99M SESTAMIBI - CARDIOLITE
24.1000 | Freq: Once | INTRAVENOUS | Status: AC | PRN
  Administered 2015-09-21: 11:00:00 24.1 via INTRAVENOUS

## 2015-09-29 ENCOUNTER — Ambulatory Visit: Payer: Self-pay | Admitting: Surgery

## 2015-11-13 ENCOUNTER — Encounter (HOSPITAL_COMMUNITY)
Admission: RE | Admit: 2015-11-13 | Discharge: 2015-11-13 | Disposition: A | Discharge status: Home / Self Care | Payer: BLUE CROSS/BLUE SHIELD | Source: Ambulatory Visit | Attending: Surgery | Admitting: Surgery

## 2015-11-13 ENCOUNTER — Encounter (HOSPITAL_COMMUNITY): Payer: Self-pay

## 2015-11-13 DIAGNOSIS — E210 Primary hyperparathyroidism: Secondary | ICD-10-CM | POA: Insufficient documentation

## 2015-11-13 DIAGNOSIS — Z01812 Encounter for preprocedural laboratory examination: Secondary | ICD-10-CM | POA: Diagnosis present

## 2015-11-13 HISTORY — DX: Hypoparathyroidism, unspecified: E20.9

## 2015-11-13 LAB — CBC
HEMATOCRIT: 34.5 % — AB (ref 36.0–46.0)
HEMOGLOBIN: 11.5 g/dL — AB (ref 12.0–15.0)
MCH: 30.8 pg (ref 26.0–34.0)
MCHC: 33.3 g/dL (ref 30.0–36.0)
MCV: 92.5 fL (ref 78.0–100.0)
Platelets: 275 10*3/uL (ref 150–400)
RBC: 3.73 MIL/uL — ABNORMAL LOW (ref 3.87–5.11)
RDW: 13.6 % (ref 11.5–15.5)
WBC: 5.7 10*3/uL (ref 4.0–10.5)

## 2015-11-13 LAB — BASIC METABOLIC PANEL
ANION GAP: 4 — AB (ref 5–15)
BUN: 17 mg/dL (ref 6–20)
CALCIUM: 10.8 mg/dL — AB (ref 8.9–10.3)
CHLORIDE: 111 mmol/L (ref 101–111)
CO2: 25 mmol/L (ref 22–32)
Creatinine, Ser: 0.8 mg/dL (ref 0.44–1.00)
GFR calc non Af Amer: >60 mL/min (ref >60)
Glucose, Bld: 92 mg/dL (ref 65–99)
Potassium: 4 mmol/L (ref 3.5–5.1)
SODIUM: 140 mmol/L (ref 135–145)

## 2015-11-13 NOTE — Pre-Procedure Instructions (Signed)
    Anita Barnett  11/13/2015      [id number] - [place], [place] [place] [address] Phone: [phone] Fax: [phone]    Your procedure is scheduled on 11-23-2015  Monday    Report to [facility] Admitting at 8:00 A.M.   Call this number if you have problems the morning of surgery:  [phone]   Remember:  Do not eat food or drink liquids after midnight.   Take these medicines the morning of surgery with A SIP OF WATER none   Do not wear jewelry, make-up or nail polish.  Do not wear lotions, powders, or perfumes.  You may not wear deodorant.  Do not shave 48 hours prior to surgery.     Do not bring valuables to the hospital.  [facility] is not responsible for any belongings or valuables.  Contacts, dentures or bridgework may not be worn into surgery.  Leave your suitcase in the car.  After surgery it may be brought to your room.  For patients admitted to the hospital, discharge time will be determined by your treatment team.  Patients discharged the day of surgery will not be allowed to drive home.    Special instructions:  See attached Sheet for instructions on CHG shower  Please read over the following fact sheets that you were given. Pain Booklet and Surgical Site Infection Prevention

## 2015-11-17 LAB — HM MAMMOGRAPHY

## 2015-11-18 ENCOUNTER — Encounter: Payer: Self-pay | Admitting: Family Medicine

## 2015-11-22 ENCOUNTER — Encounter (HOSPITAL_COMMUNITY): Payer: Self-pay | Admitting: Surgery

## 2015-11-22 MED ORDER — CEFAZOLIN SODIUM-DEXTROSE 2-3 GM-% IV SOLR
2.0000 g | INTRAVENOUS | Status: AC
  Administered 2015-11-23: 2 g via INTRAVENOUS
  Filled 2015-11-22: qty 50

## 2015-11-22 NOTE — H&P (Signed)
General Surgery [facility]  Anita Barnett DOB: 1961/03/04 Married / Language: English / Race: White Female  History of Present Illness Patient words: hyperparathyroid.  The patient is a 54 year old female who presents with a parathyroid neoplasm. Patient referred by Dr. Philemon Kingdom for evaluation of primary hyperparathyroidism. Patient's primary care physician is Dr. Shanon Ace. Patient was noted on routine laboratory studies in late 2015 to have hypercalcemia. Patient subsequently underwent additional laboratory studies performed by her primary care physician. Calcium level range from 10.8-11.3 and intact PTH level ranged from 91-117. 24-hour urine collection for calcium was obtained and was elevated at 432. Bone density scan was performed in February 2016 and was consistent with osteopenia. Patient denies any history of nephrolithiasis. She denies fatigue. She was referred to endocrinology for further evaluation and recommendations. She was noted to be vitamin D deficient and is taking vitamin D supplements at this time. No imaging studies have been obtained. Patient has no prior history of head or neck surgery. There is no family history of parathyroid disease. There is no family history of other endocrine neoplasms.  Past Surgical History Colon Polyp Removal - Colonoscopy Foot Surgery Left.  Diagnostic Studies History Colonoscopy within last year Mammogram within last year Pap Smear 1-5 years ago  Allergies No Known Drug Allergies09/07/2015  Medication History Vitamin D3 (2000UNIT Capsule, Oral) Active. Medications Reconciled  Social History  Alcohol use Moderate alcohol use. Caffeine use Carbonated beverages. No drug use Tobacco use Never smoker.  Family History Diabetes Mellitus Family Members In General. Heart Disease Father. Respiratory Condition Mother.  Pregnancy / Birth History  Age at menarche 11  years. Age of menopause 2-50 Contraceptive History Oral contraceptives. Gravida 2 Maternal age 17-35 Para 2  Review of Systems General Not Present- Appetite Loss, Chills, Fatigue, Fever, Night Sweats, Weight Gain and Weight Loss. Skin Not Present- Change in Wart/Mole, Dryness, Hives, Jaundice, New Lesions, Non-Healing Wounds, Rash and Ulcer. HEENT Not Present- Earache, Hearing Loss, Hoarseness, Nose Bleed, Oral Ulcers, Ringing in the Ears, Seasonal Allergies, Sinus Pain, Sore Throat, Visual Disturbances, Wears glasses/contact lenses and Yellow Eyes. Respiratory Not Present- Bloody sputum, Chronic Cough, Difficulty Breathing, Snoring and Wheezing. Breast Not Present- Breast Mass, Breast Pain, Nipple Discharge and Skin Changes. Cardiovascular Not Present- Chest Pain, Difficulty Breathing Lying Down, Leg Cramps, Palpitations, Rapid Heart Rate, Shortness of Breath and Swelling of Extremities. Gastrointestinal Not Present- Abdominal Pain, Bloating, Bloody Stool, Change in Bowel Habits, Chronic diarrhea, Constipation, Difficulty Swallowing, Excessive gas, Gets full quickly at meals, Hemorrhoids, Indigestion, Nausea, Rectal Pain and Vomiting. Female Genitourinary Not Present- Frequency, Nocturia, Painful Urination, Pelvic Pain and Urgency. Musculoskeletal Not Present- Back Pain, Joint Pain, Joint Stiffness, Muscle Pain, Muscle Weakness and Swelling of Extremities. Neurological Not Present- Decreased Memory, Fainting, Headaches, Numbness, Seizures, Tingling, Tremor, Trouble walking and Weakness. Endocrine Not Present- Cold Intolerance, Excessive Hunger, Hair Changes, Heat Intolerance, Hot flashes and New Diabetes. Hematology Present- Gland problems. Not Present- Easy Bruising, Excessive bleeding, HIV and Persistent Infections.  Vitals Weight: 187.8 lb Height: 67in Body Surface Area: 2.01 m Body Mass Index: 29.41 kg/m  Temp.: 97.68F(Oral)  Pulse: 60 (Regular)  BP: 120/74  (Sitting, Left Arm, Standard)   Physical Exam  General - appears comfortable, no distress; not diaphorectic  HEENT - normocephalic; sclerae clear, gaze conjugate; mucous membranes moist, dentition good; voice normal  Neck - symmetric on extension; no palpable anterior or posterior cervical adenopathy; no palpable masses in the thyroid bed  Chest - clear  bilaterally without rhonchi, rales, or wheeze  Cor - regular rhythm with normal rate; no significant murmur  Ext - non-tender without significant edema or lymphedema  Neuro - grossly intact; no tremor   Assessment & Plan   PRIMARY HYPERPARATHYROIDISM (E21.0)  Patient presents with signs and symptoms of primary hyperparathyroidism. I provided her with written literature on the surgical management of parathyroid disease to review at home.  Patient has calcium levels which exceed 11. She has osteopenia. She has elevated urinary calcium levels. I think she is a good candidate for imaging and potential minimally invasive surgery.  We will order a nuclear medicine parathyroid scan in hopes of identifying the location of a parathyroid adenoma. If the scan is positive, I believe she will be a good candidate for minimally invasive outpatient surgery. If the sestamibi scan is unrevealing, we will obtain a high-resolution ultrasound of the neck.  Patient and I discussed minimally invasive parathyroidectomy. We discussed the size of the surgical incision. We discussed performing this as an outpatient surgical procedure. We discussed her postoperative recovery and return to activity and work. She understands and wishes to proceed.  Patient will undergo nuclear medicine parathyroid scan. We will contact her with those results when they are available.  The risks and benefits of the procedure have been discussed at length with the patient.  The patient understands the proposed procedure, potential alternative treatments, and the course of recovery  to be expected.  All of the patient's questions have been answered at this time.  The patient wishes to proceed with surgery.  Earnstine Regal, MD, [facility] Office: [phone]

## 2015-11-23 ENCOUNTER — Encounter (HOSPITAL_COMMUNITY)
Admission: RE | Disposition: A | Discharge status: Home / Self Care | Payer: Self-pay | Source: Ambulatory Visit | Attending: Surgery

## 2015-11-23 ENCOUNTER — Ambulatory Visit (HOSPITAL_COMMUNITY)
Admission: RE | Admit: 2015-11-23 | Discharge: 2015-11-23 | Disposition: A | Discharge status: Home / Self Care | Payer: BLUE CROSS/BLUE SHIELD | Source: Ambulatory Visit | Attending: Surgery | Admitting: Surgery

## 2015-11-23 ENCOUNTER — Ambulatory Visit (HOSPITAL_COMMUNITY): Payer: BLUE CROSS/BLUE SHIELD | Admitting: Anesthesiology

## 2015-11-23 ENCOUNTER — Encounter (HOSPITAL_COMMUNITY): Payer: Self-pay | Admitting: *Deleted

## 2015-11-23 DIAGNOSIS — E213 Hyperparathyroidism, unspecified: Secondary | ICD-10-CM | POA: Diagnosis present

## 2015-11-23 DIAGNOSIS — M8580 Other specified disorders of bone density and structure, unspecified site: Secondary | ICD-10-CM | POA: Insufficient documentation

## 2015-11-23 DIAGNOSIS — E210 Primary hyperparathyroidism: Secondary | ICD-10-CM | POA: Insufficient documentation

## 2015-11-23 DIAGNOSIS — E559 Vitamin D deficiency, unspecified: Secondary | ICD-10-CM | POA: Diagnosis not present

## 2015-11-23 HISTORY — PX: PARATHYROIDECTOMY: SHX19

## 2015-11-23 SURGERY — PARATHYROIDECTOMY
Anesthesia: General | Site: Neck

## 2015-11-23 MED ORDER — DEXAMETHASONE SODIUM PHOSPHATE 4 MG/ML IJ SOLN
INTRAMUSCULAR | Status: AC
  Filled 2015-11-23: qty 2

## 2015-11-23 MED ORDER — SUCCINYLCHOLINE CHLORIDE 20 MG/ML IJ SOLN
INTRAMUSCULAR | Status: DC | PRN
  Administered 2015-11-23: 100 mg via INTRAVENOUS

## 2015-11-23 MED ORDER — SUCCINYLCHOLINE CHLORIDE 20 MG/ML IJ SOLN
INTRAMUSCULAR | Status: AC
  Filled 2015-11-23: qty 1

## 2015-11-23 MED ORDER — HYDROCODONE-ACETAMINOPHEN 5-325 MG PO TABS: 1.0000 | ORAL_TABLET | ORAL | Status: DC | PRN

## 2015-11-23 MED ORDER — SODIUM CHLORIDE 0.9 % IJ SOLN
INTRAMUSCULAR | Status: AC
  Filled 2015-11-23: qty 10

## 2015-11-23 MED ORDER — LIDOCAINE HCL (CARDIAC) 20 MG/ML IV SOLN
INTRAVENOUS | Status: AC
  Filled 2015-11-23: qty 5

## 2015-11-23 MED ORDER — MIDAZOLAM HCL 2 MG/2ML IJ SOLN
INTRAMUSCULAR | Status: AC
  Filled 2015-11-23: qty 2

## 2015-11-23 MED ORDER — ONDANSETRON HCL 4 MG/2ML IJ SOLN
INTRAMUSCULAR | Status: DC | PRN
  Administered 2015-11-23: 4 mg via INTRAVENOUS

## 2015-11-23 MED ORDER — PROPOFOL 10 MG/ML IV BOLUS
INTRAVENOUS | Status: AC
  Filled 2015-11-23: qty 20

## 2015-11-23 MED ORDER — DEXAMETHASONE SODIUM PHOSPHATE 4 MG/ML IJ SOLN
INTRAMUSCULAR | Status: DC | PRN
  Administered 2015-11-23: 8 mg via INTRAVENOUS

## 2015-11-23 MED ORDER — BUPIVACAINE HCL (PF) 0.25 % IJ SOLN
INTRAMUSCULAR | Status: AC
  Filled 2015-11-23: qty 30

## 2015-11-23 MED ORDER — 0.9 % SODIUM CHLORIDE (POUR BTL) OPTIME
TOPICAL | Status: DC | PRN
  Administered 2015-11-23: 1000 mL

## 2015-11-23 MED ORDER — PHENYLEPHRINE 40 MCG/ML (10ML) SYRINGE FOR IV PUSH (FOR BLOOD PRESSURE SUPPORT)
PREFILLED_SYRINGE | INTRAVENOUS | Status: AC
Start: 2015-11-23 — End: 2015-11-23
  Filled 2015-11-23: qty 10

## 2015-11-23 MED ORDER — LIDOCAINE HCL (CARDIAC) 20 MG/ML IV SOLN
INTRAVENOUS | Status: DC | PRN
  Administered 2015-11-23: 40 mg via INTRAVENOUS
  Administered 2015-11-23: 60 mg via INTRATRACHEAL

## 2015-11-23 MED ORDER — FENTANYL CITRATE (PF) 100 MCG/2ML IJ SOLN
INTRAMUSCULAR | Status: AC
  Administered 2015-11-23: 50 ug via INTRAVENOUS
  Filled 2015-11-23: qty 2

## 2015-11-23 MED ORDER — HEMOSTATIC AGENTS (NO CHARGE) OPTIME
TOPICAL | Status: DC | PRN
  Administered 2015-11-23: 1 via TOPICAL

## 2015-11-23 MED ORDER — LIDOCAINE HCL (CARDIAC) 20 MG/ML IV SOLN
INTRAVENOUS | Status: AC
  Filled 2015-11-23: qty 10

## 2015-11-23 MED ORDER — EPHEDRINE SULFATE 50 MG/ML IJ SOLN
INTRAMUSCULAR | Status: AC
  Filled 2015-11-23: qty 1

## 2015-11-23 MED ORDER — MIDAZOLAM HCL 5 MG/5ML IJ SOLN
INTRAMUSCULAR | Status: DC | PRN
  Administered 2015-11-23: 2 mg via INTRAVENOUS

## 2015-11-23 MED ORDER — ROCURONIUM BROMIDE 50 MG/5ML IV SOLN
INTRAVENOUS | Status: AC
Start: 2015-11-23 — End: 2015-11-23
  Filled 2015-11-23: qty 1

## 2015-11-23 MED ORDER — BUPIVACAINE HCL (PF) 0.25 % IJ SOLN
INTRAMUSCULAR | Status: DC | PRN
  Administered 2015-11-23: 10 mL

## 2015-11-23 MED ORDER — ONDANSETRON HCL 4 MG/2ML IJ SOLN: 4.0000 mg | Freq: Once | INTRAMUSCULAR | Status: DC | PRN

## 2015-11-23 MED ORDER — FENTANYL CITRATE (PF) 250 MCG/5ML IJ SOLN
INTRAMUSCULAR | Status: DC | PRN
  Administered 2015-11-23: 100 ug via INTRAVENOUS

## 2015-11-23 MED ORDER — OXYCODONE HCL 5 MG/5ML PO SOLN: 5.0000 mg | Freq: Once | ORAL | Status: DC | PRN

## 2015-11-23 MED ORDER — LACTATED RINGERS IV SOLN
INTRAVENOUS | Status: DC
  Administered 2015-11-23 (×2): via INTRAVENOUS

## 2015-11-23 MED ORDER — PROPOFOL 10 MG/ML IV BOLUS
INTRAVENOUS | Status: DC | PRN
  Administered 2015-11-23 (×2): 30 mg via INTRAVENOUS
  Administered 2015-11-23: 140 mg via INTRAVENOUS

## 2015-11-23 MED ORDER — OXYCODONE HCL 5 MG PO TABS: 5.0000 mg | ORAL_TABLET | Freq: Once | ORAL | Status: DC | PRN

## 2015-11-23 MED ORDER — FENTANYL CITRATE (PF) 250 MCG/5ML IJ SOLN
INTRAMUSCULAR | Status: AC
Start: 2015-11-23 — End: 2015-11-23
  Filled 2015-11-23: qty 5

## 2015-11-23 MED ORDER — FENTANYL CITRATE (PF) 100 MCG/2ML IJ SOLN
25.0000 ug | INTRAMUSCULAR | Status: DC | PRN
  Administered 2015-11-23: 50 ug via INTRAVENOUS

## 2015-11-23 SURGICAL SUPPLY — 51 items
APL SKNCLS STERI-STRIP NONHPOA (GAUZE/BANDAGES/DRESSINGS) ×1
ATTRACTOMAT 16X20 MAGNETIC DRP (DRAPES) ×2 IMPLANT
BENZOIN TINCTURE PRP APPL 2/3 (GAUZE/BANDAGES/DRESSINGS) ×2 IMPLANT
BLADE SURG 10 STRL SS (BLADE) ×1 IMPLANT
BLADE SURG 15 STRL LF DISP TIS (BLADE) ×1 IMPLANT
BLADE SURG 15 STRL SS (BLADE) ×2
CANISTER SUCTION 2500CC (MISCELLANEOUS) ×2 IMPLANT
CHLORAPREP W/TINT 26ML (MISCELLANEOUS) ×2 IMPLANT
CLIP TI MEDIUM 6 (CLIP) ×2 IMPLANT
CLIP TI WIDE RED SMALL 6 (CLIP) ×2 IMPLANT
CONT SPEC 4OZ CLIKSEAL STRL BL (MISCELLANEOUS) ×2 IMPLANT
COVER SURGICAL LIGHT HANDLE (MISCELLANEOUS) ×2 IMPLANT
CRADLE DONUT ADULT HEAD (MISCELLANEOUS) ×2 IMPLANT
DRAPE PED LAPAROTOMY (DRAPES) ×2 IMPLANT
DRAPE UTILITY XL STRL (DRAPES) ×2 IMPLANT
ELECT CAUTERY BLADE 6.4 (BLADE) ×2 IMPLANT
ELECT REM PT RETURN 9FT ADLT (ELECTROSURGICAL) ×2
ELECTRODE REM PT RTRN 9FT ADLT (ELECTROSURGICAL) ×1 IMPLANT
GAUZE SPONGE 2X2 8PLY STRL LF (GAUZE/BANDAGES/DRESSINGS) ×1 IMPLANT
GAUZE SPONGE 4X4 16PLY XRAY LF (GAUZE/BANDAGES/DRESSINGS) ×2 IMPLANT
GLOVE BIO SURGEON STRL SZ7.5 (GLOVE) ×1 IMPLANT
GLOVE BIOGEL PI IND STRL 7.5 (GLOVE) IMPLANT
GLOVE BIOGEL PI INDICATOR 7.5 (GLOVE) ×1
GLOVE SURG ORTHO 8.0 STRL STRW (GLOVE) ×2 IMPLANT
GOWN STRL REUS W/ TWL LRG LVL3 (GOWN DISPOSABLE) ×1 IMPLANT
GOWN STRL REUS W/ TWL XL LVL3 (GOWN DISPOSABLE) ×1 IMPLANT
GOWN STRL REUS W/TWL LRG LVL3 (GOWN DISPOSABLE) ×2
GOWN STRL REUS W/TWL XL LVL3 (GOWN DISPOSABLE) ×2
HEMOSTAT SURGICEL 2X4 FIBR (HEMOSTASIS) ×2 IMPLANT
KIT BASIN OR (CUSTOM PROCEDURE TRAY) ×2 IMPLANT
KIT ROOM TURNOVER OR (KITS) ×2 IMPLANT
NDL HYPO 25GX1X1/2 BEV (NEEDLE) ×1 IMPLANT
NEEDLE HYPO 25GX1X1/2 BEV (NEEDLE) ×2 IMPLANT
NS IRRIG 1000ML POUR BTL (IV SOLUTION) ×2 IMPLANT
PACK SURGICAL SETUP 50X90 (CUSTOM PROCEDURE TRAY) ×2 IMPLANT
PAD ARMBOARD 7.5X6 YLW CONV (MISCELLANEOUS) ×2 IMPLANT
PENCIL BUTTON HOLSTER BLD 10FT (ELECTRODE) ×2 IMPLANT
SPONGE GAUZE 2X2 STER 10/PKG (GAUZE/BANDAGES/DRESSINGS) ×1
SPONGE INTESTINAL PEANUT (DISPOSABLE) ×1 IMPLANT
STRIP CLOSURE SKIN 1/2X4 (GAUZE/BANDAGES/DRESSINGS) ×2 IMPLANT
SUT MNCRL AB 4-0 PS2 18 (SUTURE) ×2 IMPLANT
SUT SILK 2 0 (SUTURE) ×2
SUT SILK 2-0 18XBRD TIE 12 (SUTURE) IMPLANT
SUT SILK 3 0 (SUTURE)
SUT SILK 3-0 18XBRD TIE 12 (SUTURE) IMPLANT
SUT VIC AB 3-0 SH 18 (SUTURE) ×2 IMPLANT
SYR BULB 3OZ (MISCELLANEOUS) ×2 IMPLANT
SYR CONTROL 10ML LL (SYRINGE) IMPLANT
TOWEL OR 17X24 6PK STRL BLUE (TOWEL DISPOSABLE) ×2 IMPLANT
TOWEL OR 17X26 10 PK STRL BLUE (TOWEL DISPOSABLE) ×1 IMPLANT
TUBE CONNECTING 12X1/4 (SUCTIONS) ×2 IMPLANT

## 2015-11-23 NOTE — Anesthesia Postprocedure Evaluation (Signed)
Anesthesia Post Note  Patient: Anita Barnett  Procedure(s) Performed: Procedure(s) (LRB): LEFT INFERIOR PARATHYROIDECTOMY (N/A)  Patient location during evaluation: PACU Anesthesia Type: General Level of consciousness: awake, awake and alert, oriented and patient cooperative Pain management: pain level controlled Vital Signs Assessment: post-procedure vital signs reviewed and stable Respiratory status: spontaneous breathing and patient connected to nasal cannula oxygen Anesthetic complications: no    Last Vitals:  Filed Vitals:   11/23/15 1145 11/23/15 1200  BP: 98/71 104/74  Pulse: 53 55  Temp: 36.5 C 36.7 C  Resp: 16 19    Last Pain:  Filed Vitals:   11/23/15 1241  PainSc: 0-No pain    LLE Motor Response: Purposeful movement   RLE Motor Response: Purposeful movement        [name]

## 2015-11-23 NOTE — Transfer of Care (Signed)
Immediate Anesthesia Transfer of Care Note  Patient: Anita Barnett  Procedure(s) Performed: Procedure(s): LEFT INFERIOR PARATHYROIDECTOMY (N/A)  Patient Location: PACU  Anesthesia Type:General  Level of Consciousness: awake, alert , oriented and patient cooperative  Airway & Oxygen Therapy: Patient Spontanous Breathing and Patient connected to nasal cannula oxygen  Post-op Assessment: Report given to RN, Post -op Vital signs reviewed and stable and Patient moving all extremities  Post vital signs: Reviewed and stable  Last Vitals:  Filed Vitals:   11/23/15 0818  BP: 113/70  Pulse: 60  Temp: 36.7 C  Resp: 20    Complications: No apparent anesthesia complications

## 2015-11-23 NOTE — Interval H&P Note (Signed)
History and Physical Interval Note:  11/23/2015 9:34 AM  Anita Barnett  has presented today for surgery, with the diagnosis of Primary Hyperparathyroidism.  The various methods of treatment have been discussed with the patient and family. After consideration of risks, benefits and other options for treatment, the patient has consented to    Procedure(s): PARATHYROIDECTOMY (N/A) as a surgical intervention .    The patient's history has been reviewed, patient examined, no change in status, stable for surgery.  I have reviewed the patient's chart and labs.  Questions were answered to the patient's satisfaction.    Earnstine Regal, MD, [facility] Office: [place]

## 2015-11-23 NOTE — Anesthesia Procedure Notes (Signed)
Procedure Name: Intubation Date/Time: 11/23/2015 10:01 AM Performed by: Julian Reil Pre-anesthesia Checklist: Patient identified, Emergency Drugs available, Suction available and Patient being monitored Patient Re-evaluated:Patient Re-evaluated prior to inductionOxygen Delivery Method: Circle system utilized Preoxygenation: Pre-oxygenation with 100% oxygen Intubation Type: IV induction Ventilation: Mask ventilation without difficulty Laryngoscope Size: Mac and 3 Grade View: Grade I Tube type: Oral Tube size: 7.0 mm Number of attempts: 1 Airway Equipment and Method: Stylet and LTA kit utilized Placement Confirmation: ETT inserted through vocal cords under direct vision,  positive ETCO2 and breath sounds checked- equal and bilateral Secured at: 21 cm Tube secured with: Tape Dental Injury: Teeth and Oropharynx as per pre-operative assessment

## 2015-11-23 NOTE — Anesthesia Preprocedure Evaluation (Addendum)
Anesthesia Evaluation  Patient identified by MRN, date of birth, ID band Patient awake    Reviewed: Allergy & Precautions, NPO status , Patient's Chart, lab work & pertinent test results  Airway Mallampati: II  TM Distance: >3 FB Neck ROM: Full    Dental  (+) Teeth Intact, Dental Advisory Given   Pulmonary    breath sounds clear to auscultation       Cardiovascular  Rhythm:Regular Rate:Normal     Neuro/Psych    GI/Hepatic   Endo/Other    Renal/GU      Musculoskeletal   Abdominal   Peds  Hematology   Anesthesia Other Findings   Reproductive/Obstetrics                             Anesthesia Physical Anesthesia Plan  ASA: II  Anesthesia Plan: General   Post-op Pain Management:    Induction: Intravenous  Airway Management Planned: Oral ETT  Additional Equipment:   Intra-op Plan:   Post-operative Plan: Extubation in OR  Informed Consent: I have reviewed the patients History and Physical, chart, labs and discussed the procedure including the risks, benefits and alternatives for the proposed anesthesia with the patient or authorized representative who has indicated his/her understanding and acceptance.   Dental advisory given  Plan Discussed with: CRNA and Anesthesiologist  Anesthesia Plan Comments: (Primary hyperparathyroidism, Calcium 10.8  Plan GA with oral ETT  Roberts Gaudy )        Anesthesia Quick Evaluation

## 2015-11-23 NOTE — Op Note (Signed)
OPERATIVE REPORT - PARATHYROIDECTOMY  Preoperative diagnosis: Primary hyperparathyroidism  Postop diagnosis: Same  Procedure: Left inferior minimally invasive parathyroidectomy  Surgeon:  Earnstine Regal, MD, FACS  Anesthesia: Gen. endotracheal  Estimated blood loss: Minimal  Preparation: ChloraPrep  Indications: The patient is a 54 year old female who presents with a parathyroid neoplasm. Patient referred by Dr. Philemon Kingdom for evaluation of primary hyperparathyroidism. Patient's primary care physician is Dr. Shanon Ace. Patient was noted on routine laboratory studies in late 2015 to have hypercalcemia. Patient subsequently underwent additional laboratory studies performed by her primary care physician. Calcium level range from 10.8-11.3 and intact PTH level ranged from 91-117. 24-hour urine collection for calcium was obtained and was elevated at 432. Bone density scan was performed in February 2016 and was consistent with osteopenia. Nuclear med parathyroid scan positive for left inferior adenoma.  Procedure: Patient was prepared in the holding area. He was brought to operating room and placed in a supine position on the operating room table. Following administration of general anesthesia, the patient was positioned and then prepped and draped in the usual strict aseptic fashion. After ascertaining that an adequate level of anesthesia been achieved, a neck incision was made with a #15 blade. Dissection was carried through subcutaneous tissues and platysma. Hemostasis was obtained with the electrocautery. Skin flaps were developed circumferentially and a Weitlander retractor was placed for exposure.  Strap muscles were incised in the midline. Strap muscles were reflected exposing the thyroid lobe. With gentle blunt dissection the thyroid lobe was mobilized.  Dissection was carried through adipose tissue and an enlarged parathyroid gland was identified. It was gently mobilized.  Vascular structures were divided between small and medium ligaclips. Care was taken to avoid the recurrent laryngeal nerve and the esophagus. The parathyroid gland was completely excised. It was submitted to pathology where frozen section confirmed parathyroid tissue consistent with adenoma.  Neck was irrigated with warm saline and good hemostasis was noted. Fibrillar was placed in the operative field. Strap muscles were reapproximated in the midline with interrupted 3-0 Vicryl sutures. Platysma was closed with interrupted 3-0 Vicryl sutures. Skin was closed with a running 4-0 Monocryl subcuticular suture. Marcaine was infiltrated circumferentially. Wound was washed and dried and benzoin and Steri-Strips were applied. Sterile gauze dressings were applied. Patient was awakened from anesthesia and brought to the recovery room. The patient tolerated the procedure well.   Earnstine Regal, MD, [facility]

## 2015-11-24 ENCOUNTER — Encounter (HOSPITAL_COMMUNITY): Payer: Self-pay | Admitting: Surgery

## 2015-12-04 ENCOUNTER — Telehealth: Payer: Self-pay | Admitting: Internal Medicine

## 2015-12-04 NOTE — Telephone Encounter (Signed)
error 

## 2015-12-10 ENCOUNTER — Ambulatory Visit: Payer: BLUE CROSS/BLUE SHIELD | Admitting: Internal Medicine

## 2016-01-18 ENCOUNTER — Encounter: Payer: Self-pay | Admitting: Nurse Practitioner

## 2016-01-18 ENCOUNTER — Ambulatory Visit (INDEPENDENT_AMBULATORY_CARE_PROVIDER_SITE_OTHER): Payer: 59 | Admitting: Nurse Practitioner

## 2016-01-18 VITALS — BP 108/70 | HR 68 | Ht 66.75 in | Wt 188.0 lb

## 2016-01-18 DIAGNOSIS — Z0000 Encounter for general adult medical examination without abnormal findings: Secondary | ICD-10-CM | POA: Diagnosis not present

## 2016-01-18 DIAGNOSIS — Z01419 Encounter for gynecological examination (general) (routine) without abnormal findings: Secondary | ICD-10-CM

## 2016-01-18 NOTE — Patient Instructions (Signed)

## 2016-01-18 NOTE — Progress Notes (Signed)
Patient ID: Anita Barnett, female   DOB: 09-Sep-1961, 55 y.o.   MRN: [id number]  55 y.o. [id number] Married  Caucasian Fe here for annual exam.  Since here last year diagnosis with hypercalcemia and hyper parathyroid. Surgery 11/23/15 with removal of one left parathyroid.  Doing well.  All labs back to normal.  Patient's last menstrual period was 12/26/2009 (approximate).          Sexually active: Yes.    The current method of family planning is post menopausal status.    Exercising: Yes.    yoga, weights, tennis, walking/jogging 5 times per week Smoker:  no  Health Maintenance: Pap: 01/15/15, Negative with neg HR HPV MMG:11/17/15, Bi-Rads 1: Negative  Colonoscopy: 12/09/14, hyperplastic polyp, repeat in 10 years BMD: 02/18/15 T Score -1.1 Spine / -1.3 Right Femur Neck / -1.5 Left Femur Neck TDaP:01/15/15 Hep C and HIV: Hep C completed 02/04/15, HIV-will have with PCP Labs: PCP takes care of all labs   reports that she has never smoked. She has never used smokeless tobacco. She reports that she drinks about 1.2 - 1.8 oz of alcohol per week. She reports that she does not use illicit drugs.  Past Medical History  Diagnosis Date  . Seasonal allergies   . Colon polyps   . Hx of varicella   . History of shingles   . Idiopathic parathyroidism [facility])     Past Surgical History  Procedure Laterality Date  . Toe surgery Left 1996    pin in little toe  . Parathyroidectomy N/A 11/23/2015    Procedure: LEFT INFERIOR PARATHYROIDECTOMY;  Surgeon: Armandina Gemma, MD;  Location: [place];  Service: General;  Laterality: N/A;    No current outpatient prescriptions on file.   No current facility-administered medications for this visit.    Family History  Problem Relation Age of Onset  . COPD Mother     smoker  . Heart disease Father     CABG x 3  . Diabetes Maternal Grandfather     type 1    ROS:  Pertinent items are noted in HPI.  Otherwise, a comprehensive ROS was negative.  Exam:   BP  108/70 mmHg  Pulse 68  Ht 5' 6.75" (1.695 m)  Wt 188 lb (85.276 kg)  BMI 29.68 kg/m2  LMP 12/26/2009 (Approximate) Height: 5' 6.75" (169.5 cm) Ht Readings from Last 3 Encounters:  01/18/16 5' 6.75" (1.695 m)  11/23/15 5\' 7"  (1.702 m)  11/13/15 5\' 7"  (1.702 m)    General appearance: alert, cooperative and appears stated age Head: Normocephalic, without obvious abnormality, atraumatic Neck: no adenopathy, supple, symmetrical, trachea midline and thyroid normal to inspection and palpation Lungs: clear to auscultation bilaterally Breasts: normal appearance, no masses or tenderness Heart: regular rate and rhythm Abdomen: soft, non-tender; no masses,  no organomegaly Extremities: extremities normal, atraumatic, no cyanosis or edema Skin: Skin color, texture, turgor normal. No rashes or lesions Lymph nodes: Cervical, supraclavicular, and axillary nodes normal. No abnormal inguinal nodes palpated Neurologic: Grossly normal   Pelvic: External genitalia:  no lesions              Urethra:  normal appearing urethra with no masses, tenderness or lesions              Bartholin's and Skene's: normal                 Vagina: normal appearing vagina with normal color and discharge, no lesions  Cervix: anteverted              Pap taken: No. Bimanual Exam:  Uterus:  normal size, contour, position, consistency, mobility, non-tender              Adnexa: no mass, fullness, tenderness               Rectovaginal: Confirms               Anus:  normal sphincter tone, no lesions  Chaperone present: no  A:  Well Woman with normal exam  Postmenopausal - no HRT S/P removal of parathyroid gland on left X 1  History of hypercalcemia and hyperparathyroid  P:   Reviewed health and wellness pertinent to exam  Pap smear as above  mammogram counseled on breast self exam, mammography screening, adequate intake of calcium and vitamin D, diet and exercise, Kegel's  exercises return annually or prn  An After Visit Summary was printed and given to the patient.

## 2016-01-21 ENCOUNTER — Ambulatory Visit: Payer: BLUE CROSS/BLUE SHIELD | Admitting: Nurse Practitioner

## 2016-01-22 NOTE — Progress Notes (Signed)
Encounter reviewed by Dr. Brook Amundson C. Silva.  

## 2016-03-28 NOTE — Progress Notes (Signed)
Chief Complaint  Patient presents with  . Follow Up Labs    HPI: Anita Barnett 55 y.o.  Last seen June 2016  Had lef inf parathyroidectomy  11 16  Post  pth removal    ? conitnuing .  Vit    D not taking  Saw gyne ok  Recheck. And  Her GI  Dr Collene Mares wanted to make sure she had a fu alk phos level done She is on no meds  And stopped taking vit d  Over 3 months ago is active and well .  ROS: See pertinent positives and negatives per HPI.  Past Medical History  Diagnosis Date  . Seasonal allergies   . Colon polyps   . Hx of varicella   . History of shingles   . Idiopathic parathyroidism ([place])     Family History  Problem Relation Age of Onset  . COPD Mother     smoker  . Heart disease Father     CABG x 3  . Diabetes Maternal Grandfather     type 1    Social History   Social History  . Marital Status: Married    Spouse Name: N/A  . Number of Children: 2  . Years of Education: N/A   Occupational History  . cpa    Social History Main Topics  . Smoking status: Never Smoker   . Smokeless tobacco: Never Used  . Alcohol Use: 1.2 - 1.8 oz/week    2-3 Cans of beer per week  . Drug Use: No  . Sexual Activity:    Partners: Male    Birth Control/ Protection: Post-menopausal   Other Topics Concern  . 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    Lives with her husband.    Has 2 children in college.   G2P2   nneg ets fa    No outpatient prescriptions prior to visit.   No facility-administered medications prior to visit.     EXAM:  BP 118/80 mmHg  Pulse 79  Temp(Src) 98.2 F (36.8 C) (Oral)  Ht 5' 6.75" (1.695 m)  Wt 191 lb (86.637 kg)  BMI 30.16 kg/m2  SpO2 98%  LMP 12/26/2009 (Approximate)  Body mass index is 30.16 kg/(m^2).  GENERAL: vitals reviewed and listed above, alert, oriented, appears well hydrated and in no acute distress HEENT: atraumatic, conjunctiva   clear, no obvious abnormalities on inspection of external nose and ears OP : no lesion edema or exudate  NECK: no obvious masses on inspection palpation  wel lhealed scar  LUNGS: clear to auscultation bilaterally, no wheezes, rales or rhonchi, good air movement CV: HRRR, no clubbing cyanosis or  peripheral edema nl cap refill  Abdomen:  Sof,t normal bowel sounds without hepatosplenomegaly, no guarding rebound or masses no CVA tenderness MS: moves all extremities without noticeable focal  abnormality PSYCH: pleasant and cooperative, no obvious depression or anxiety Lab Results  Component Value Date   WBC 5.7 11/13/2015   HGB 11.5* 11/13/2015   HCT 34.5* 11/13/2015   PLT 275 11/13/2015   GLUCOSE 92 11/13/2015   CHOL 148 02/04/2015   TRIG 132.0 02/04/2015   HDL 48.40 02/04/2015   LDLCALC 73 02/04/2015   ALT 21 02/04/2015   AST 17 02/04/2015   NA 140 11/13/2015   K 4.0 11/13/2015   CL 111 11/13/2015  CREATININE 0.80 11/13/2015   BUN 17 11/13/2015   CO2 25 11/13/2015   TSH 2.02 02/04/2015  record reviewed   ASSESSMENT AND PLAN:  Discussed the following assessment and plan:  Elevated alkaline phosphatase level - Plan: Basic metabolic panel, CBC with Differential/Platelet, Hepatic function panel, TSH, VITAMIN D 25 Hydroxy (Vit-D Deficiency, Fractures), Gamma GT, PTH, intact and calcium  Low serum vitamin D - level today - Plan: Basic metabolic panel, CBC with Differential/Platelet, Hepatic function panel, TSH, VITAMIN D 25 Hydroxy (Vit-D Deficiency, Fractures), Gamma GT, PTH, intact and calcium  Hx of hyperparathyroidism - removed surgically - Plan: Basic metabolic panel, CBC with Differential/Platelet, Hepatic function panel, TSH, VITAMIN D 25 Hydroxy (Vit-D Deficiency, Fractures), Gamma GT, PTH, intact and calcium  Osteopenia - Plan: Basic metabolic panel, CBC with Differential/Platelet, Hepatic function panel, TSH, VITAMIN D 25 Hydroxy (Vit-D Deficiency, Fractures), Gamma GT, PTH,  intact and calcium If utd on health care parameters lab ok  And getting gyne checks can go every other year wellness cpx.  -Patient advised to return or notify health care team  if symptoms worsen ,persist or new concerns arise.  Patient Instructions  Will notify you  of labs when available. Suspect the elevated  Enzyme was related to low vit and hyperparathyroid state last year. If all ok then routine follow up   Yearly here or at your gyne    [place] protect organs, store calcium, and anchor muscles. Good health habits, such as eating nutritious foods and exercising regularly, are important for maintaining healthy bones. They can also help to prevent a condition that causes bones to lose density and become weak and brittle (osteoporosis). WHY IS BONE MASS IMPORTANT? Bone mass refers to the amount of bone tissue that you have. The higher your bone mass, the stronger your bones. An important step toward having healthy bones throughout life is to have strong and dense bones during childhood. A young adult who has a high bone mass is more likely to have a high bone mass later in life. Bone mass at its greatest it is called peak bone mass. A large decline in bone mass occurs in older adults. In women, it occurs about the time of menopause. During this time, it is important to practice good health habits, because if more bone is lost than what is replaced, the bones will become less healthy and more likely to break (fracture). If you find that you have a low bone mass, you may be able to prevent osteoporosis or further bone loss by changing your diet and lifestyle. HOW CAN I FIND OUT IF MY BONE MASS IS LOW? Bone mass can be measured with an X-ray test that is called a bone mineral density (BMD) test. This test is recommended for all women who are age 48 or older. It may also be recommended for men who are age 64 or older, or for people who are more likely to develop osteoporosis due  to:  Having bones that break easily.  Having a long-term disease that weakens bones, such as kidney disease or rheumatoid arthritis.  Having menopause earlier than normal.  Taking medicine that weakens bones, such as steroids, thyroid hormones, or hormone treatment for breast cancer or prostate cancer.  Smoking.  Drinking three or more alcoholic drinks each day. WHAT ARE THE NUTRITIONAL RECOMMENDATIONS FOR HEALTHY BONES? To have healthy bones, you need to get enough of the right minerals and vitamins. Most nutrition experts recommend getting these nutrients from the  foods that you eat. Nutritional recommendations vary from person to person. Ask your health care provider what is healthy for you. Here are some general guidelines. Calcium Recommendations Calcium is the most important (essential) mineral for bone health. Most people can get enough calcium from their diet, but supplements may be recommended for people who are at risk for osteoporosis. Good sources of calcium include:  Dairy products, such as low-fat or nonfat milk, cheese, and yogurt.  Dark green leafy vegetables, such as bok choy and broccoli.  Calcium-fortified foods, such as orange juice, cereal, bread, soy beverages, and tofu products.  Nuts, such as almonds. Follow these recommended amounts for daily calcium intake:  Children, age 31-3: 700 mg.  Children, age 56-8: 1,000 mg.  Children, age 71-13: 1,300 mg.  Teens, age 70-18: 1,300 mg.  Adults, age 318-50: 1,000 mg.  Adults, age 85-70:  Men: 1,000 mg.  Women: 1,200 mg.  Adults, age 62 or older: 1,200 mg.  Pregnant and breastfeeding females:  Teens: 1,300 mg.  Adults: 1,000 mg. Vitamin D Recommendations Vitamin D is the most essential vitamin for bone health. It helps the body to absorb calcium. Sunlight stimulates the skin to make vitamin D, so be sure to get enough sunlight. If you live in a cold climate or you do not get outside often, your health care  provider may recommend that you take vitamin D supplements. Good sources of vitamin D in your diet include:  Egg yolks.  Saltwater fish.  Milk and cereal fortified with vitamin D. Follow these recommended amounts for daily vitamin D intake:  Children and teens, age 31-18: 600 international units.  Adults, age 59 or younger: 400-800 international units.  Adults, age 66 or older: 800-1,000 international units. Other Nutrients Other nutrients for bone health include:  Phosphorus. This mineral is found in meat, poultry, dairy foods, nuts, and legumes. The recommended daily intake for adult men and adult women is 700 mg.  Magnesium. This mineral is found in seeds, nuts, dark green vegetables, and legumes. The recommended daily intake for adult men is 400-420 mg. For adult women, it is 310-320 mg.  Vitamin K. This vitamin is found in green leafy vegetables. The recommended daily intake is 120 mg for adult men and 90 mg for adult women. WHAT TYPE OF PHYSICAL ACTIVITY IS BEST FOR BUILDING AND MAINTAINING HEALTHY BONES? Weight-bearing and strength-building activities are important for building and maintaining peak bone mass. Weight-bearing activities cause muscles and bones to work against gravity. Strength-building activities increases muscle strength that supports bones. Weight-bearing and muscle-building activities include:  Walking and hiking.  Jogging and running.  Dancing.  Gym exercises.  Lifting weights.  Tennis and racquetball.  Climbing stairs.  Aerobics. Adults should get at least 30 minutes of moderate physical activity on most days. Children should get at least 60 minutes of moderate physical activity on most days. Ask your health care provide what type of exercise is best for you. WHERE CAN I FIND MORE INFORMATION? For more information, check out the following websites:  [place]: YardHomes.se  [employer] of Health:  http://www.niams.AnonymousEar.fr.asp   This information is not intended to replace advice given to you by your health care provider. Make sure you discuss any questions you have with your health care provider.   Document Released: 03/03/2004 Document Revised: 04/28/2015 Document Reviewed: 12/17/2014 Elsevier Interactive Patient Education 2016 [place] Maintenance, Female Adopting a healthy lifestyle and getting preventive care can go  a long way to promote health and wellness. Talk with your health care provider about what schedule of regular examinations is right for you. This is a good chance for you to check in with your provider about disease prevention and staying healthy. In between checkups, there are plenty of things you can do on your own. Experts have done a lot of research about which lifestyle changes and preventive measures are most likely to keep you healthy. Ask your health care provider for more information. WEIGHT AND DIET  Eat a healthy diet  Be sure to include plenty of vegetables, fruits, low-fat dairy products, and lean protein.  Do not eat a lot of foods high in solid fats, added sugars, or salt.  Get regular exercise. This is one of the most important things you can do for your health.  Most adults should exercise for at least 150 minutes each week. The exercise should increase your heart rate and make you sweat (moderate-intensity exercise).  Most adults should also do strengthening exercises at least twice a week. This is in addition to the moderate-intensity exercise.  Maintain a healthy weight  Body mass index (BMI) is a measurement that can be used to identify possible weight problems. It estimates body fat based on height and weight. Your health care provider can help determine your BMI and help you achieve or maintain a healthy weight.  For females 69 years of age and older:   A BMI below 18.5  is considered underweight.  A BMI of 18.5 to 24.9 is normal.  A BMI of 25 to 29.9 is considered overweight.  A BMI of 30 and above is considered obese.  Watch levels of cholesterol and blood lipids  You should start having your blood tested for lipids and cholesterol at 55 years of age, then have this test every 5 years.  You may need to have your cholesterol levels checked more often if:  Your lipid or cholesterol levels are high.  You are older than 55 years of age.  You are at high risk for heart disease.  CANCER SCREENING   Lung Cancer  Lung cancer screening is recommended for adults 11-26 years old who are at high risk for lung cancer because of a history of smoking.  A yearly low-dose CT scan of the lungs is recommended for people who:  Currently smoke.  Have quit within the past 15 years.  Have at least a 30-pack-year history of smoking. A pack year is smoking an average of one pack of cigarettes a day for 1 year.  Yearly screening should continue until it has been 15 years since you quit.  Yearly screening should stop if you develop a health problem that would prevent you from having lung cancer treatment.  Breast Cancer  Practice breast self-awareness. This means understanding how your breasts normally appear and feel.  It also means doing regular breast self-exams. Let your health care provider know about any changes, no matter how small.  If you are in your 20s or 30s, you should have a clinical breast exam (CBE) by a health care provider every 1-3 years as part of a regular health exam.  If you are 52 or older, have a CBE every year. Also consider having a breast X-ray (mammogram) every year.  If you have a family history of breast cancer, talk to your health care provider about genetic screening.  If you are at high risk for breast cancer, talk to your health care provider  about having an MRI and a mammogram every year.  Breast cancer gene (BRCA)  assessment is recommended for women who have family members with BRCA-related cancers. BRCA-related cancers include:  Breast.  Ovarian.  Tubal.  Peritoneal cancers.  Results of the assessment will determine the need for genetic counseling and BRCA1 and BRCA2 testing. Cervical Cancer Your health care provider may recommend that you be screened regularly for cancer of the pelvic organs (ovaries, uterus, and vagina). This screening involves a pelvic examination, including checking for microscopic changes to the surface of your cervix (Pap test). You may be encouraged to have this screening done every 3 years, beginning at age 49.  For women ages 24-65, health care providers may recommend pelvic exams and Pap testing every 3 years, or they may recommend the Pap and pelvic exam, combined with testing for human papilloma virus (HPV), every 5 years. Some types of HPV increase your risk of cervical cancer. Testing for HPV may also be done on women of any age with unclear Pap test results.  Other health care providers may not recommend any screening for nonpregnant women who are considered low risk for pelvic cancer and who do not have symptoms. Ask your health care provider if a screening pelvic exam is right for you.  If you have had past treatment for cervical cancer or a condition that could lead to cancer, you need Pap tests and screening for cancer for at least 20 years after your treatment. If Pap tests have been discontinued, your risk factors (such as having a new sexual partner) need to be reassessed to determine if screening should resume. Some women have medical problems that increase the chance of getting cervical cancer. In these cases, your health care provider may recommend more frequent screening and Pap tests. Colorectal Cancer  This type of cancer can be detected and often prevented.  Routine colorectal cancer screening usually begins at 55 years of age and continues through 55 years  of age.  Your health care provider may recommend screening at an earlier age if you have risk factors for colon cancer.  Your health care provider may also recommend using home test kits to check for hidden blood in the stool.  A small camera at the end of a tube can be used to examine your colon directly (sigmoidoscopy or colonoscopy). This is done to check for the earliest forms of colorectal cancer.  Routine screening usually begins at age 34.  Direct examination of the colon should be repeated every 5-10 years through 55 years of age. However, you may need to be screened more often if early forms of precancerous polyps or small growths are found. Skin Cancer  Check your skin from head to toe regularly.  Tell your health care provider about any new moles or changes in moles, especially if there is a change in a mole's shape or color.  Also tell your health care provider if you have a mole that is larger than the size of a pencil eraser.  Always use sunscreen. Apply sunscreen liberally and repeatedly throughout the day.  Protect yourself by wearing long sleeves, pants, a wide-brimmed hat, and sunglasses whenever you are outside. HEART DISEASE, DIABETES, AND HIGH BLOOD PRESSURE   High blood pressure causes heart disease and increases the risk of stroke. High blood pressure is more likely to develop in:  People who have blood pressure in the high end of the normal range (130-139/85-89 mm Hg).  People who are overweight or  obese.  People who are African American.  If you are 105-61 years of age, have your blood pressure checked every 3-5 years. If you are 77 years of age or older, have your blood pressure checked every year. You should have your blood pressure measured twice--once when you are at a hospital or clinic, and once when you are not at a hospital or clinic. Record the average of the two measurements. To check your blood pressure when you are not at a hospital or clinic, you  can use:  An automated blood pressure machine at a pharmacy.  A home blood pressure monitor.  If you are between 19 years and 84 years old, ask your health care provider if you should take aspirin to prevent strokes.  Have regular diabetes screenings. This involves taking a blood sample to check your fasting blood sugar level.  If you are at a normal weight and have a low risk for diabetes, have this test once every three years after 55 years of age.  If you are overweight and have a high risk for diabetes, consider being tested at a younger age or more often. PREVENTING INFECTION  Hepatitis B  If you have a higher risk for hepatitis B, you should be screened for this virus. You are considered at high risk for hepatitis B if:  You were born in a country where hepatitis B is common. Ask your health care provider which countries are considered high risk.  Your parents were born in a high-risk country, and you have not been immunized against hepatitis B (hepatitis B vaccine).  You have HIV or AIDS.  You use needles to inject street drugs.  You live with someone who has hepatitis B.  You have had sex with someone who has hepatitis B.  You get hemodialysis treatment.  You take certain medicines for conditions, including cancer, organ transplantation, and autoimmune conditions. Hepatitis C  Blood testing is recommended for:  Everyone born from [birth year] through 1965.  Anyone with known risk factors for hepatitis C. Sexually transmitted infections (STIs)  You should be screened for sexually transmitted infections (STIs) including gonorrhea and chlamydia if:  You are sexually active and are younger than 56 years of age.  You are older than 55 years of age and your health care provider tells you that you are at risk for this type of infection.  Your sexual activity has changed since you were last screened and you are at an increased risk for chlamydia or gonorrhea. Ask your health  care provider if you are at risk.  If you do not have HIV, but are at risk, it may be recommended that you take a prescription medicine daily to prevent HIV infection. This is called pre-exposure prophylaxis (PrEP). You are considered at risk if:  You are sexually active and do not regularly use condoms or know the HIV status of your partner(s).  You take drugs by injection.  You are sexually active with a partner who has HIV. Talk with your health care provider about whether you are at high risk of being infected with HIV. If you choose to begin PrEP, you should first be tested for HIV. You should then be tested every 3 months for as long as you are taking PrEP.  PREGNANCY   If you are premenopausal and you may become pregnant, ask your health care provider about preconception counseling.  If you may become pregnant, take 400 to 800 micrograms (mcg) of folic acid every day.  If you want to prevent pregnancy, talk to your health care provider about birth control (contraception). OSTEOPOROSIS AND MENOPAUSE   Osteoporosis is a disease in which the bones lose minerals and strength with aging. This can result in serious bone fractures. Your risk for osteoporosis can be identified using a bone density scan.  If you are 48 years of age or older, or if you are at risk for osteoporosis and fractures, ask your health care provider if you should be screened.  Ask your health care provider whether you should take a calcium or vitamin D supplement to lower your risk for osteoporosis.  Menopause may have certain physical symptoms and risks.  Hormone replacement therapy may reduce some of these symptoms and risks. Talk to your health care provider about whether hormone replacement therapy is right for you.  HOME CARE INSTRUCTIONS   Schedule regular health, dental, and eye exams.  Stay current with your immunizations.   Do not use any tobacco products including cigarettes, chewing tobacco, or  electronic cigarettes.  If you are pregnant, do not drink alcohol.  If you are breastfeeding, limit how much and how often you drink alcohol.  Limit alcohol intake to no more than 1 drink per day for nonpregnant women. One drink equals 12 ounces of beer, 5 ounces of wine, or 1 ounces of hard liquor.  Do not use street drugs.  Do not share needles.  Ask your health care provider for help if you need support or information about quitting drugs.  Tell your health care provider if you often feel depressed.  Tell your health care provider if you have ever been abused or do not feel safe at home.   This information is not intended to replace advice given to you by your health care provider. Make sure you discuss any questions you have with your health care provider.   Document Released: 06/27/2011 Document Revised: 01/02/2015 Document Reviewed: 11/13/2013 Elsevier Interactive Patient Education 2016 [place] K. Panosh M.D.

## 2016-03-29 ENCOUNTER — Encounter: Payer: Self-pay | Admitting: Internal Medicine

## 2016-03-29 ENCOUNTER — Ambulatory Visit (INDEPENDENT_AMBULATORY_CARE_PROVIDER_SITE_OTHER): Payer: 59 | Admitting: Internal Medicine

## 2016-03-29 VITALS — BP 118/80 | HR 79 | Temp 98.2°F | Ht 66.75 in | Wt 191.0 lb

## 2016-03-29 DIAGNOSIS — M8580 Other specified disorders of bone density and structure, unspecified site: Secondary | ICD-10-CM

## 2016-03-29 DIAGNOSIS — Z8639 Personal history of other endocrine, nutritional and metabolic disease: Secondary | ICD-10-CM

## 2016-03-29 DIAGNOSIS — R748 Abnormal levels of other serum enzymes: Secondary | ICD-10-CM | POA: Diagnosis not present

## 2016-03-29 DIAGNOSIS — R7989 Other specified abnormal findings of blood chemistry: Secondary | ICD-10-CM | POA: Diagnosis not present

## 2016-03-29 LAB — CBC WITH DIFFERENTIAL/PLATELET
BASOS PCT: 0.5 % (ref 0.0–3.0)
Basophils Absolute: 0 10*3/uL (ref 0.0–0.1)
EOS PCT: 1.1 % (ref 0.0–5.0)
Eosinophils Absolute: 0.1 10*3/uL (ref 0.0–0.7)
HEMATOCRIT: 37.4 % (ref 36.0–46.0)
HEMOGLOBIN: 12.7 g/dL (ref 12.0–15.0)
Lymphocytes Relative: 25.5 % (ref 12.0–46.0)
Lymphs Abs: 1.7 10*3/uL (ref 0.7–4.0)
MCHC: 34 g/dL (ref 30.0–36.0)
MCV: 90.7 fl (ref 78.0–100.0)
Monocytes Absolute: 0.6 10*3/uL (ref 0.1–1.0)
Monocytes Relative: 8.5 % (ref 3.0–12.0)
Neutro Abs: 4.3 10*3/uL (ref 1.4–7.7)
Neutrophils Relative %: 64.4 % (ref 43.0–77.0)
Platelets: 312 10*3/uL (ref 150.0–400.0)
RBC: 4.12 Mil/uL (ref 3.87–5.11)
RDW: 14.3 % (ref 11.5–15.5)
WBC: 6.7 10*3/uL (ref 4.0–10.5)

## 2016-03-29 LAB — BASIC METABOLIC PANEL
BUN: 20 mg/dL (ref 6–23)
CHLORIDE: 106 meq/L (ref 96–112)
CO2: 29 mEq/L (ref 19–32)
Calcium: 9.6 mg/dL (ref 8.4–10.5)
Creatinine, Ser: 0.72 mg/dL (ref 0.40–1.20)
GFR: 89.59 mL/min (ref >60.00)
Glucose, Bld: 104 mg/dL — ABNORMAL HIGH (ref 70–99)
POTASSIUM: 4 meq/L (ref 3.5–5.1)
Sodium: 140 mEq/L (ref 135–145)

## 2016-03-29 LAB — HEPATIC FUNCTION PANEL
ALT: 16 U/L (ref 0–35)
AST: 17 U/L (ref 0–37)
Albumin: 4.4 g/dL (ref 3.5–5.2)
Alkaline Phosphatase: 86 U/L (ref 39–117)
BILIRUBIN TOTAL: 0.5 mg/dL (ref 0.2–1.2)
Bilirubin, Direct: 0.1 mg/dL (ref 0.0–0.3)
Total Protein: 7.5 g/dL (ref 6.0–8.3)

## 2016-03-29 LAB — VITAMIN D 25 HYDROXY (VIT D DEFICIENCY, FRACTURES): VITD: 26.02 ng/mL — AB (ref 30.00–100.00)

## 2016-03-29 LAB — GAMMA GT: GGT: 13 U/L (ref 7–51)

## 2016-03-29 LAB — TSH: TSH: 1.84 u[IU]/mL (ref 0.35–4.50)

## 2016-03-29 NOTE — Progress Notes (Signed)
Pre visit review using our clinic review tool, if applicable. No additional management support is needed unless otherwise documented below in the visit note. 

## 2016-03-29 NOTE — Patient Instructions (Addendum)
Will notify you  of labs when available. Suspect the elevated  Enzyme was related to low vit and hyperparathyroid state last year. If all ok then routine follow up   Yearly here or at your gyne    [place] protect organs, store calcium, and anchor muscles. Good health habits, such as eating nutritious foods and exercising regularly, are important for maintaining healthy bones. They can also help to prevent a condition that causes bones to lose density and become weak and brittle (osteoporosis). WHY IS BONE MASS IMPORTANT? Bone mass refers to the amount of bone tissue that you have. The higher your bone mass, the stronger your bones. An important step toward having healthy bones throughout life is to have strong and dense bones during childhood. A young adult who has a high bone mass is more likely to have a high bone mass later in life. Bone mass at its greatest it is called peak bone mass. A large decline in bone mass occurs in older adults. In women, it occurs about the time of menopause. During this time, it is important to practice good health habits, because if more bone is lost than what is replaced, the bones will become less healthy and more likely to break (fracture). If you find that you have a low bone mass, you may be able to prevent osteoporosis or further bone loss by changing your diet and lifestyle. HOW CAN I FIND OUT IF MY BONE MASS IS LOW? Bone mass can be measured with an X-ray test that is called a bone mineral density (BMD) test. This test is recommended for all women who are age 788 or older. It may also be recommended for men who are age 3 or older, or for people who are more likely to develop osteoporosis due to:  Having bones that break easily.  Having a long-term disease that weakens bones, such as kidney disease or rheumatoid arthritis.  Having menopause earlier than normal.  Taking medicine that weakens bones, such as steroids, thyroid hormones, or hormone  treatment for breast cancer or prostate cancer.  Smoking.  Drinking three or more alcoholic drinks each day. WHAT ARE THE NUTRITIONAL RECOMMENDATIONS FOR HEALTHY BONES? To have healthy bones, you need to get enough of the right minerals and vitamins. Most nutrition experts recommend getting these nutrients from the foods that you eat. Nutritional recommendations vary from person to person. Ask your health care provider what is healthy for you. Here are some general guidelines. Calcium Recommendations Calcium is the most important (essential) mineral for bone health. Most people can get enough calcium from their diet, but supplements may be recommended for people who are at risk for osteoporosis. Good sources of calcium include:  Dairy products, such as low-fat or nonfat milk, cheese, and yogurt.  Dark green leafy vegetables, such as bok choy and broccoli.  Calcium-fortified foods, such as orange juice, cereal, bread, soy beverages, and tofu products.  Nuts, such as almonds. Follow these recommended amounts for daily calcium intake:  Children, age 78-3: 700 mg.  Children, age 49-8: 1,000 mg.  Children, age 78-13: 1,300 mg.  Teens, age 32-18: 1,300 mg.  Adults, age 72-50: 1,000 mg.  Adults, age 789-70:  Men: 1,000 mg.  Women: 1,200 mg.  Adults, age 73 or older: 1,200 mg.  Pregnant and breastfeeding females:  Teens: 1,300 mg.  Adults: 1,000 mg. Vitamin D Recommendations Vitamin D is the most essential vitamin for bone health. It helps the body to absorb calcium. Sunlight  stimulates the skin to make vitamin D, so be sure to get enough sunlight. If you live in a cold climate or you do not get outside often, your health care provider may recommend that you take vitamin D supplements. Good sources of vitamin D in your diet include:  Egg yolks.  Saltwater fish.  Milk and cereal fortified with vitamin D. Follow these recommended amounts for daily vitamin D intake:  Children and  teens, age 77-18: 600 international units.  Adults, age 771 or younger: 400-800 international units.  Adults, age 66 or older: 800-1,000 international units. Other Nutrients Other nutrients for bone health include:  Phosphorus. This mineral is found in meat, poultry, dairy foods, nuts, and legumes. The recommended daily intake for adult men and adult women is 700 mg.  Magnesium. This mineral is found in seeds, nuts, dark green vegetables, and legumes. The recommended daily intake for adult men is 400-420 mg. For adult women, it is 310-320 mg.  Vitamin K. This vitamin is found in green leafy vegetables. The recommended daily intake is 120 mg for adult men and 90 mg for adult women. WHAT TYPE OF PHYSICAL ACTIVITY IS BEST FOR BUILDING AND MAINTAINING HEALTHY BONES? Weight-bearing and strength-building activities are important for building and maintaining peak bone mass. Weight-bearing activities cause muscles and bones to work against gravity. Strength-building activities increases muscle strength that supports bones. Weight-bearing and muscle-building activities include:  Walking and hiking.  Jogging and running.  Dancing.  Gym exercises.  Lifting weights.  Tennis and racquetball.  Climbing stairs.  Aerobics. Adults should get at least 30 minutes of moderate physical activity on most days. Children should get at least 60 minutes of moderate physical activity on most days. Ask your health care provide what type of exercise is best for you. WHERE CAN I FIND MORE INFORMATION? For more information, check out the following websites:  [place]: YardHomes.se  [employer] of Health: http://www.niams.AnonymousEar.fr.asp   This information is not intended to replace advice given to you by your health care provider. Make sure you discuss any questions you have with your health care provider.   Document  Released: 03/03/2004 Document Revised: 04/28/2015 Document Reviewed: 12/17/2014 Elsevier Interactive Patient Education 2016 [place] Maintenance, Female Adopting a healthy lifestyle and getting preventive care can go a long way to promote health and wellness. Talk with your health care provider about what schedule of regular examinations is right for you. This is a good chance for you to check in with your provider about disease prevention and staying healthy. In between checkups, there are plenty of things you can do on your own. Experts have done a lot of research about which lifestyle changes and preventive measures are most likely to keep you healthy. Ask your health care provider for more information. WEIGHT AND DIET  Eat a healthy diet  Be sure to include plenty of vegetables, fruits, low-fat dairy products, and lean protein.  Do not eat a lot of foods high in solid fats, added sugars, or salt.  Get regular exercise. This is one of the most important things you can do for your health.  Most adults should exercise for at least 150 minutes each week. The exercise should increase your heart rate and make you sweat (moderate-intensity exercise).  Most adults should also do strengthening exercises at least twice a week. This is in addition to the moderate-intensity exercise.  Maintain a healthy weight  Body mass index (BMI)  is a measurement that can be used to identify possible weight problems. It estimates body fat based on height and weight. Your health care provider can help determine your BMI and help you achieve or maintain a healthy weight.  For females 57 years of age and older:   A BMI below 18.5 is considered underweight.  A BMI of 18.5 to 24.9 is normal.  A BMI of 25 to 29.9 is considered overweight.  A BMI of 30 and above is considered obese.  Watch levels of cholesterol and blood lipids  You should start having your blood tested for lipids and  cholesterol at 55 years of age, then have this test every 5 years.  You may need to have your cholesterol levels checked more often if:  Your lipid or cholesterol levels are high.  You are older than 55 years of age.  You are at high risk for heart disease.  CANCER SCREENING   Lung Cancer  Lung cancer screening is recommended for adults 13-62 years old who are at high risk for lung cancer because of a history of smoking.  A yearly low-dose CT scan of the lungs is recommended for people who:  Currently smoke.  Have quit within the past 15 years.  Have at least a 30-pack-year history of smoking. A pack year is smoking an average of one pack of cigarettes a day for 1 year.  Yearly screening should continue until it has been 15 years since you quit.  Yearly screening should stop if you develop a health problem that would prevent you from having lung cancer treatment.  Breast Cancer  Practice breast self-awareness. This means understanding how your breasts normally appear and feel.  It also means doing regular breast self-exams. Let your health care provider know about any changes, no matter how small.  If you are in your 20s or 30s, you should have a clinical breast exam (CBE) by a health care provider every 1-3 years as part of a regular health exam.  If you are 31 or older, have a CBE every year. Also consider having a breast X-ray (mammogram) every year.  If you have a family history of breast cancer, talk to your health care provider about genetic screening.  If you are at high risk for breast cancer, talk to your health care provider about having an MRI and a mammogram every year.  Breast cancer gene (BRCA) assessment is recommended for women who have family members with BRCA-related cancers. BRCA-related cancers include:  Breast.  Ovarian.  Tubal.  Peritoneal cancers.  Results of the assessment will determine the need for genetic counseling and BRCA1 and BRCA2  testing. Cervical Cancer Your health care provider may recommend that you be screened regularly for cancer of the pelvic organs (ovaries, uterus, and vagina). This screening involves a pelvic examination, including checking for microscopic changes to the surface of your cervix (Pap test). You may be encouraged to have this screening done every 3 years, beginning at age 30.  For women ages 43-65, health care providers may recommend pelvic exams and Pap testing every 3 years, or they may recommend the Pap and pelvic exam, combined with testing for human papilloma virus (HPV), every 5 years. Some types of HPV increase your risk of cervical cancer. Testing for HPV may also be done on women of any age with unclear Pap test results.  Other health care providers may not recommend any screening for nonpregnant women who are considered low risk for pelvic  cancer and who do not have symptoms. Ask your health care provider if a screening pelvic exam is right for you.  If you have had past treatment for cervical cancer or a condition that could lead to cancer, you need Pap tests and screening for cancer for at least 20 years after your treatment. If Pap tests have been discontinued, your risk factors (such as having a new sexual partner) need to be reassessed to determine if screening should resume. Some women have medical problems that increase the chance of getting cervical cancer. In these cases, your health care provider may recommend more frequent screening and Pap tests. Colorectal Cancer  This type of cancer can be detected and often prevented.  Routine colorectal cancer screening usually begins at 55 years of age and continues through 55 years of age.  Your health care provider may recommend screening at an earlier age if you have risk factors for colon cancer.  Your health care provider may also recommend using home test kits to check for hidden blood in the stool.  A small camera at the end of a  tube can be used to examine your colon directly (sigmoidoscopy or colonoscopy). This is done to check for the earliest forms of colorectal cancer.  Routine screening usually begins at age 66.  Direct examination of the colon should be repeated every 5-10 years through 56 years of age. However, you may need to be screened more often if early forms of precancerous polyps or small growths are found. Skin Cancer  Check your skin from head to toe regularly.  Tell your health care provider about any new moles or changes in moles, especially if there is a change in a mole's shape or color.  Also tell your health care provider if you have a mole that is larger than the size of a pencil eraser.  Always use sunscreen. Apply sunscreen liberally and repeatedly throughout the day.  Protect yourself by wearing long sleeves, pants, a wide-brimmed hat, and sunglasses whenever you are outside. HEART DISEASE, DIABETES, AND HIGH BLOOD PRESSURE   High blood pressure causes heart disease and increases the risk of stroke. High blood pressure is more likely to develop in:  People who have blood pressure in the high end of the normal range (130-139/85-89 mm Hg).  People who are overweight or obese.  People who are African American.  If you are 4-88 years of age, have your blood pressure checked every 3-5 years. If you are 40 years of age or older, have your blood pressure checked every year. You should have your blood pressure measured twice--once when you are at a hospital or clinic, and once when you are not at a hospital or clinic. Record the average of the two measurements. To check your blood pressure when you are not at a hospital or clinic, you can use:  An automated blood pressure machine at a pharmacy.  A home blood pressure monitor.  If you are between 46 years and 36 years old, ask your health care provider if you should take aspirin to prevent strokes.  Have regular diabetes screenings. This  involves taking a blood sample to check your fasting blood sugar level.  If you are at a normal weight and have a low risk for diabetes, have this test once every three years after 55 years of age.  If you are overweight and have a high risk for diabetes, consider being tested at a younger age or more often. PREVENTING INFECTION  Hepatitis B  If you have a higher risk for hepatitis B, you should be screened for this virus. You are considered at high risk for hepatitis B if:  You were born in a country where hepatitis B is common. Ask your health care provider which countries are considered high risk.  Your parents were born in a high-risk country, and you have not been immunized against hepatitis B (hepatitis B vaccine).  You have HIV or AIDS.  You use needles to inject street drugs.  You live with someone who has hepatitis B.  You have had sex with someone who has hepatitis B.  You get hemodialysis treatment.  You take certain medicines for conditions, including cancer, organ transplantation, and autoimmune conditions. Hepatitis C  Blood testing is recommended for:  Everyone born from [birth year] through 1965.  Anyone with known risk factors for hepatitis C. Sexually transmitted infections (STIs)  You should be screened for sexually transmitted infections (STIs) including gonorrhea and chlamydia if:  You are sexually active and are younger than 55 years of age.  You are older than 55 years of age and your health care provider tells you that you are at risk for this type of infection.  Your sexual activity has changed since you were last screened and you are at an increased risk for chlamydia or gonorrhea. Ask your health care provider if you are at risk.  If you do not have HIV, but are at risk, it may be recommended that you take a prescription medicine daily to prevent HIV infection. This is called pre-exposure prophylaxis (PrEP). You are considered at risk if:  You are  sexually active and do not regularly use condoms or know the HIV status of your partner(s).  You take drugs by injection.  You are sexually active with a partner who has HIV. Talk with your health care provider about whether you are at high risk of being infected with HIV. If you choose to begin PrEP, you should first be tested for HIV. You should then be tested every 3 months for as long as you are taking PrEP.  PREGNANCY   If you are premenopausal and you may become pregnant, ask your health care provider about preconception counseling.  If you may become pregnant, take 400 to 800 micrograms (mcg) of folic acid every day.  If you want to prevent pregnancy, talk to your health care provider about birth control (contraception). OSTEOPOROSIS AND MENOPAUSE   Osteoporosis is a disease in which the bones lose minerals and strength with aging. This can result in serious bone fractures. Your risk for osteoporosis can be identified using a bone density scan.  If you are 31 years of age or older, or if you are at risk for osteoporosis and fractures, ask your health care provider if you should be screened.  Ask your health care provider whether you should take a calcium or vitamin D supplement to lower your risk for osteoporosis.  Menopause may have certain physical symptoms and risks.  Hormone replacement therapy may reduce some of these symptoms and risks. Talk to your health care provider about whether hormone replacement therapy is right for you.  HOME CARE INSTRUCTIONS   Schedule regular health, dental, and eye exams.  Stay current with your immunizations.   Do not use any tobacco products including cigarettes, chewing tobacco, or electronic cigarettes.  If you are pregnant, do not drink alcohol.  If you are breastfeeding, limit how much and how often you drink alcohol.  Limit alcohol intake to no more than 1 drink per day for nonpregnant women. One drink equals 12 ounces of beer, 5  ounces of wine, or 1 ounces of hard liquor.  Do not use street drugs.  Do not share needles.  Ask your health care provider for help if you need support or information about quitting drugs.  Tell your health care provider if you often feel depressed.  Tell your health care provider if you have ever been abused or do not feel safe at home.   This information is not intended to replace advice given to you by your health care provider. Make sure you discuss any questions you have with your health care provider.   Document Released: 06/27/2011 Document Revised: 01/02/2015 Document Reviewed: 11/13/2013 Elsevier Interactive Patient Education [employer].

## 2016-03-30 LAB — PTH, INTACT AND CALCIUM
CALCIUM: 9.3 mg/dL (ref 8.4–10.5)
PTH: 48 pg/mL (ref 14–64)

## 2016-11-28 LAB — HM MAMMOGRAPHY

## 2016-11-30 ENCOUNTER — Encounter: Payer: Self-pay | Admitting: Family Medicine

## 2016-12-07 ENCOUNTER — Encounter: Payer: Self-pay | Admitting: Nurse Practitioner

## 2016-12-28 ENCOUNTER — Ambulatory Visit (INDEPENDENT_AMBULATORY_CARE_PROVIDER_SITE_OTHER): Payer: 59 | Admitting: Family Medicine

## 2016-12-28 ENCOUNTER — Encounter: Payer: Self-pay | Admitting: Family Medicine

## 2016-12-28 VITALS — BP 118/82 | HR 82 | Temp 98.2°F | Wt 195.9 lb

## 2016-12-28 DIAGNOSIS — J019 Acute sinusitis, unspecified: Secondary | ICD-10-CM | POA: Diagnosis not present

## 2016-12-28 DIAGNOSIS — Z23 Encounter for immunization: Secondary | ICD-10-CM | POA: Diagnosis not present

## 2016-12-28 MED ORDER — AMOXICILLIN-POT CLAVULANATE 875-125 MG PO TABS: 1.0000 | ORAL_TABLET | Freq: Two times a day (BID) | ORAL | 0 refills | Status: DC

## 2016-12-28 NOTE — Patient Instructions (Signed)

## 2016-12-28 NOTE — Progress Notes (Signed)
Pre visit review using our clinic review tool, if applicable. No additional management support is needed unless otherwise documented below in the visit note. 

## 2016-12-28 NOTE — Progress Notes (Signed)
Subjective:     Patient ID: Anita Barnett, female   DOB: 19-Nov-1961, 56 y.o.   MRN: [id number]  HPI Acute visit for upper respiratory infections symptoms. She started with typical head cold type symptoms about 8 days ago. She's had over the past few days some progressive sinus congestion and upper teeth pain. She's had facial pain over her ethmoid and maxillary sinus region. Intermittent headaches. Occasionally clear and occasionally colored nasal discharge. She has occasional cough which is mostly nonproductive. No fevers or chills. Increased malaise. She feels symptoms have worsened past couple of days  Past Medical History:  Diagnosis Date  . Colon polyps   . History of shingles   . Hx of varicella   . Idiopathic parathyroidism ([place])   . Seasonal allergies    Past Surgical History:  Procedure Laterality Date  . PARATHYROIDECTOMY N/A 11/23/2015   Procedure: LEFT INFERIOR PARATHYROIDECTOMY;  Surgeon: Armandina Gemma, MD;  Location: [facility];  Service: General;  Laterality: N/A;  . TOE SURGERY Left 1996   pin in little toe    reports that she has never smoked. She has never used smokeless tobacco. She reports that she drinks about 1.2 - 1.8 oz of alcohol per week . She reports that she does not use drugs. family history includes COPD in her mother; Diabetes in her maternal grandfather; Heart disease in her father. No Known Allergies   Review of Systems  Constitutional: Positive for fatigue. Negative for chills and fever.  HENT: Positive for congestion.   Respiratory: Positive for cough.   Neurological: Positive for headaches.       Objective:   Physical Exam  Constitutional: She appears well-developed and well-nourished. No distress.  HENT:  Right Ear: External ear normal.  Left Ear: External ear normal.  Mouth/Throat: Oropharynx is clear and moist.  Neck: Neck supple.  Cardiovascular: Normal rate and regular rhythm.   Pulmonary/Chest: Effort normal and breath sounds normal. No  respiratory distress. She has no wheezes. She has no rales.  Lymphadenopathy:    She has no cervical adenopathy.       Assessment:     Probable acute sinusitis    Plan:     -Given the fact she's had progressive symptoms past couple days, upper teeth pain, and progressive facial pain start Augmentin 875 mg twice daily for 10 days -Consider over-the-counter plain Mucinex -Stay well-hydrated -Follow-up as needed -requesting flu vaccine.  No contraindication since she is afebrile.  Eulas Post MD [facility]

## 2017-01-18 ENCOUNTER — Ambulatory Visit (INDEPENDENT_AMBULATORY_CARE_PROVIDER_SITE_OTHER): Payer: 59 | Admitting: Nurse Practitioner

## 2017-01-18 ENCOUNTER — Encounter: Payer: Self-pay | Admitting: Nurse Practitioner

## 2017-01-18 VITALS — BP 110/68 | HR 68 | Resp 12 | Ht 66.25 in | Wt 193.6 lb

## 2017-01-18 DIAGNOSIS — E213 Hyperparathyroidism, unspecified: Secondary | ICD-10-CM | POA: Diagnosis not present

## 2017-01-18 DIAGNOSIS — Z01411 Encounter for gynecological examination (general) (routine) with abnormal findings: Secondary | ICD-10-CM

## 2017-01-18 DIAGNOSIS — Z0000 Encounter for general adult medical examination without abnormal findings: Secondary | ICD-10-CM

## 2017-01-18 DIAGNOSIS — M8580 Other specified disorders of bone density and structure, unspecified site: Secondary | ICD-10-CM

## 2017-01-18 LAB — POCT URINALYSIS DIPSTICK
Blood, UA: NEGATIVE
GLUCOSE UA: NEGATIVE
Ketones, UA: NEGATIVE
LEUKOCYTES UA: NEGATIVE
NITRITE UA: NEGATIVE
PROTEIN UA: NEGATIVE
Urobilinogen, UA: NEGATIVE
pH, UA: 5

## 2017-01-18 NOTE — Patient Instructions (Signed)

## 2017-01-18 NOTE — Progress Notes (Signed)
56 y.o. G90P2002 Married  Caucasian Fe here for annual exam.    Patient's last menstrual period was 12/26/2009 (approximate).          Sexually active: Yes.    The current method of family planning is post menopausal status.    Exercising: Yes.    Yoga, tennis, walking, jogging, weight lifting Smoker:  no  Health Maintenance: Pap: 01/15/2015 WNL neg HR HPV MMG:  11/28/16 BIRADS1, Density B, [facility] Colonoscopy: 12/09/14, hyperplastic polyp, repeat in 10 years BMD:  02/18/15 T Score -1.1 Spine / -1.3 Right Femur Neck / -1.5 Left Femur Neck TDaP: 01/15/15 Shingles: Never Pneumonia: Never Hep C and HIV: blood drawn today Labs: blood drawn today           Urine: negative   reports that she has never smoked. She has never used smokeless tobacco. She reports that she drinks about 1.2 - 1.8 oz of alcohol per week . She reports that she does not use drugs.  Past Medical History:  Diagnosis Date  . Colon polyps   . History of shingles   . Hx of varicella   . Idiopathic parathyroidism ([place])   . Seasonal allergies     Past Surgical History:  Procedure Laterality Date  . PARATHYROIDECTOMY N/A 11/23/2015   Procedure: LEFT INFERIOR PARATHYROIDECTOMY;  Surgeon: Armandina Gemma, MD;  Location: [facility];  Service: General;  Laterality: N/A;  . TOE SURGERY Left 1996   pin in little toe    No current outpatient prescriptions on file.   No current facility-administered medications for this visit.     Family History  Problem Relation Age of Onset  . COPD Mother     smoker  . Heart disease Father     CABG x 3  . Diabetes Maternal Grandfather     type 1    ROS:  Pertinent items are noted in HPI.  Otherwise, a comprehensive ROS was negative.  Exam:   BP 110/68 (BP Location: Right Arm, Patient Position: Sitting, Cuff Size: Normal)   Pulse 68   Resp 12   Ht 5' 6.25" (1.683 m)   Wt 193 lb 9.6 oz (87.8 kg)   LMP 12/26/2009 (Approximate)   BMI 31.01 kg/m  Height: 5' 6.25" (168.3 cm) Ht Readings  from Last 3 Encounters:  01/18/17 5' 6.25" (1.683 m)  03/29/16 5' 6.75" (1.695 m)  01/18/16 5' 6.75" (1.695 m)    General appearance: alert, cooperative and appears stated age Head: Normocephalic, without obvious abnormality, atraumatic Neck: no adenopathy, supple, symmetrical, trachea midline and thyroid normal to inspection and palpation Lungs: clear to auscultation bilaterally Breasts: normal appearance, no masses or tenderness Heart: regular rate and rhythm Abdomen: soft, non-tender; no masses,  no organomegaly Extremities: extremities normal, atraumatic, no cyanosis or edema Skin: Skin color, texture, turgor normal. No rashes or lesions Lymph nodes: Cervical, supraclavicular, and axillary nodes normal. No abnormal inguinal nodes palpated Neurologic: Grossly normal   Pelvic: External genitalia:  no lesions              Urethra:  normal appearing urethra with no masses, tenderness or lesions              Bartholin's and Skene's: normal                 Vagina: normal appearing vagina with normal color and discharge, no lesions              Cervix: anteverted  Pap taken: No. Bimanual Exam:  Uterus:  normal size, contour, position, consistency, mobility, non-tender              Adnexa: no mass, fullness, tenderness               Rectovaginal: Confirms               Anus:  normal sphincter tone, no lesions  Chaperone present: yes  A:  Well Woman with normal exam    Postmenopausal - no HRT S/P removal of parathyroid gland on left X 1             History of hypercalcemia and hyperparathyroid   P:   Reviewed health and wellness pertinent to exam  Pap smear not done  Mammogram Is due 12/18  Order for BMD faxed to [facility]  Will follow with PCP about further labs  Counseled on breast self exam, mammography screening, adequate intake of calcium and vitamin D, diet and exercise, Kegel's exercises return annually or prn  An After Visit Summary was  printed and given to the patient.

## 2017-01-19 LAB — HIV ANTIBODY (ROUTINE TESTING W REFLEX): HIV: NONREACTIVE

## 2017-01-22 NOTE — Progress Notes (Signed)
Encounter reviewed by Dr. Brook Amundson C. Silva.  

## 2017-02-07 DIAGNOSIS — R110 Nausea: Secondary | ICD-10-CM | POA: Diagnosis not present

## 2017-02-07 DIAGNOSIS — R1084 Generalized abdominal pain: Secondary | ICD-10-CM | POA: Diagnosis not present

## 2017-02-28 DIAGNOSIS — M8588 Other specified disorders of bone density and structure, other site: Secondary | ICD-10-CM | POA: Diagnosis not present

## 2017-02-28 LAB — HM DEXA SCAN

## 2017-03-01 ENCOUNTER — Encounter: Payer: Self-pay | Admitting: Internal Medicine

## 2017-03-07 ENCOUNTER — Encounter: Payer: Self-pay | Admitting: Family Medicine

## 2017-03-07 DIAGNOSIS — H524 Presbyopia: Secondary | ICD-10-CM | POA: Diagnosis not present

## 2017-03-08 ENCOUNTER — Telehealth: Payer: Self-pay | Admitting: Nurse Practitioner

## 2017-03-08 NOTE — Telephone Encounter (Signed)
Please let pt know that BMD done on 02/28/17 shows a T Score at the spine of -1.20; right hip neck at -0.80; left hip neck -0.90.  The lowest range is the spine and falls in the low normal or upper Osteopenic range. Comparison to previous study 02/18/15 show and increase is both hips at 13 & 12 %.  The spine comparison was not made with new BMD machine but the score was -1.1 02/18/15.  The FRAX score for major fracture in 10 yrs is 9.9% (goal is < 20 %);  The FRAX score for hip fracture is  0.5% (goal is < 3%).  Her FRAX score is higher than expected with [facility] of Osteoporosis and personal history of fracture along with hyperparathyroid   She needs to continue exercise program and Vit D.   Needs a repeat study in 2 yrs.

## 2017-03-10 NOTE — Telephone Encounter (Signed)
I have attempted to contact this patient by phone with the following results: left message to return call to [place] at [phone] on answering machine (mobile per [facility]).  Name verified in [place]. Advised call was regarding recent bone density exam. [phone] (Mobile) *Preferred*

## 2017-03-13 NOTE — Telephone Encounter (Signed)
Patient notified of results and is agreeable with plan to continue exercise and Vit D and repeat exam in 2 years.

## 2017-05-18 ENCOUNTER — Ambulatory Visit (INDEPENDENT_AMBULATORY_CARE_PROVIDER_SITE_OTHER): Payer: 59 | Admitting: Sports Medicine

## 2017-05-18 ENCOUNTER — Ambulatory Visit: Payer: Self-pay

## 2017-05-18 ENCOUNTER — Encounter: Payer: Self-pay | Admitting: Sports Medicine

## 2017-05-18 VITALS — BP 134/80 | Ht 67.0 in | Wt 185.0 lb

## 2017-05-18 DIAGNOSIS — M23301 Other meniscus derangements, unspecified lateral meniscus, left knee: Secondary | ICD-10-CM | POA: Diagnosis not present

## 2017-05-18 DIAGNOSIS — M25562 Pain in left knee: Secondary | ICD-10-CM

## 2017-05-18 NOTE — Progress Notes (Signed)
CC: Left knee pain  Patient played 3 matches tennis on hard courts in 2 days Pain going left and right This was 4 weeks ago Yoga became painful Hurts medial knee No prior serious knee issues  Past Med Hx Hyperparathyroidism - stable  ROS No locking No giving way Swelling +/-  PE Pleasant overweight W F BP 134/80   Ht 5\' 7"  (1.702 m)   Wt 185 lb (83.9 kg)   LMP 12/26/2009 (Approximate)   BMI 28.98 kg/m    LT knee Knee: Puffiness and possible superior effusion  no obvious bony abnormalities. Palpation normal with no warmth or joint line tenderness or patellar tenderness or condyle tenderness. ROM normal in flexion and extension and lower leg rotation. Pain at terminal flexion Ligaments with solid consistent endpoints including ACL, PCL, LCL, MCL. Negative Mcmurray's and provocative meniscal tests. Non painful patellar compression. Patellar and quadriceps tendons unremarkable.  Ultrasound of Left Knee There is a moderate effusion of suprapatellar pouch Small bakers cyst Lateral meniscus shows an area at Midline with splitting Parameniscal cyst Medial meniscus some degenerative change with narrowing No significant spurring  Comparison to Right knee with only small effusion  Impression ; moderate effusion and degenerative lateral meniscal tear left knee

## 2017-05-18 NOTE — Assessment & Plan Note (Signed)
Compression sleeve HEP  Relative rest with more non WB exercise  Reck and repeat scan in 4 weeks  No tennis until reassessed Modify yoga

## 2017-06-22 ENCOUNTER — Encounter: Payer: Self-pay | Admitting: Sports Medicine

## 2017-06-22 ENCOUNTER — Ambulatory Visit: Payer: Self-pay

## 2017-06-22 ENCOUNTER — Ambulatory Visit (INDEPENDENT_AMBULATORY_CARE_PROVIDER_SITE_OTHER): Payer: 59 | Admitting: Sports Medicine

## 2017-06-22 VITALS — BP 112/70 | Ht 67.0 in | Wt 185.0 lb

## 2017-06-22 DIAGNOSIS — M23301 Other meniscus derangements, unspecified lateral meniscus, left knee: Secondary | ICD-10-CM

## 2017-06-22 NOTE — Assessment & Plan Note (Signed)
Improved with rest and rehab exercises  Will keep up HEP  Ease back to tennis  Compression when playing  Prn recheck

## 2017-06-22 NOTE — Progress Notes (Signed)
   Subjective:    Patient ID: Anita Barnett, female    DOB: 21-Dec-1961, 56 y.o.   MRN: [id number]  HPI  Ms. Eber is a 56 yo female who presents for follow up of left knee pain. She was last seen in May where she was found to have a moderate effusion and degenerative lateral meniscal tear in the left knee. She was given a knee sleeve and home exercises. She reports she has been wearing a sleeve and exercises although not daily. She has also been doing yoga and and working on quad strength with a trainer. She reports she now only has some discomfort on the lateral knee. She only has pain with flexion of the knee during child's pose in yoga. She feels that she is about 70-80% improved. She has not started playing tennis again. She denies her leg getting locked but does have painless popping of her knee.   Review of Systems No weakness, numbness or tingling of the left lower extremity     Objective:   Physical Exam Gen: NAD BP 112/70   Ht 5\' 7"  (1.702 m)   Wt 185 lb (83.9 kg)   LMP 12/26/2009 (Approximate)   BMI 28.98 kg/m   Left Knee:  Very mild effusion noted compared to the right knee  Very mild soreness to palpation of the posterolateral joint line  5/5 strength in the lower extremities Ligaments are intact  Negative McMurray tests   Normal ROM but flexion 10 deg less than on RT  Limited US of Left Knee:  Mild effusion under the suprapatellar pouch (improved from prior ultrasound)  Small bakers cyst Lateral meniscus shows an area at midline with splitting  Medial meniscus some degenerative change with narrowing  No significant spurring    Impression:  Ultrasound consistent with degenerative meniscal changes and small effusion;  Baker's cyst.  effusion  Improved from prior scan.  Ultrasound and interpretation by Wolfgang Phoenix. Fields, MD    Assessment & Plan:   Degenerative lateral meniscal tear in the left knee: Symptoms overall improving. Continue sleeve with physical  activity. Can start tennis slowly with pain as her guide.  - Follow up PRN      Smiley Houseman, MD PGY 2 Family Medicine   I observed and examined the patient with the resident and agree with assessment and plan.  Note reviewed and modified by me. Stefanie Libel, MD

## 2017-09-15 ENCOUNTER — Encounter: Payer: Self-pay | Admitting: Internal Medicine

## 2017-11-01 DIAGNOSIS — Z23 Encounter for immunization: Secondary | ICD-10-CM | POA: Diagnosis not present

## 2017-11-29 DIAGNOSIS — Z1231 Encounter for screening mammogram for malignant neoplasm of breast: Secondary | ICD-10-CM | POA: Diagnosis not present

## 2017-11-29 LAB — HM MAMMOGRAPHY

## 2017-12-11 ENCOUNTER — Encounter: Payer: Self-pay | Admitting: Internal Medicine

## 2017-12-15 ENCOUNTER — Encounter: Payer: Self-pay | Admitting: Obstetrics and Gynecology

## 2017-12-26 HISTORY — PX: TONSILLECTOMY: SUR1361

## 2018-04-02 ENCOUNTER — Emergency Department (HOSPITAL_BASED_OUTPATIENT_CLINIC_OR_DEPARTMENT_OTHER): Payer: 59

## 2018-04-02 ENCOUNTER — Emergency Department (HOSPITAL_BASED_OUTPATIENT_CLINIC_OR_DEPARTMENT_OTHER)
Admission: EM | Admit: 2018-04-02 | Discharge: 2018-04-02 | Disposition: A | Discharge status: Home / Self Care | Payer: 59 | Attending: Emergency Medicine | Admitting: Emergency Medicine

## 2018-04-02 ENCOUNTER — Encounter (HOSPITAL_BASED_OUTPATIENT_CLINIC_OR_DEPARTMENT_OTHER): Payer: Self-pay | Admitting: Emergency Medicine

## 2018-04-02 ENCOUNTER — Other Ambulatory Visit: Payer: Self-pay

## 2018-04-02 ENCOUNTER — Encounter (HOSPITAL_BASED_OUTPATIENT_CLINIC_OR_DEPARTMENT_OTHER): Payer: Self-pay | Admitting: *Deleted

## 2018-04-02 ENCOUNTER — Ambulatory Visit (INDEPENDENT_AMBULATORY_CARE_PROVIDER_SITE_OTHER): Payer: 59 | Admitting: Sports Medicine

## 2018-04-02 VITALS — BP 118/82 | Ht 67.0 in | Wt 180.0 lb

## 2018-04-02 DIAGNOSIS — M7121 Synovial cyst of popliteal space [Baker], right knee: Secondary | ICD-10-CM | POA: Insufficient documentation

## 2018-04-02 DIAGNOSIS — M25561 Pain in right knee: Secondary | ICD-10-CM | POA: Diagnosis not present

## 2018-04-02 DIAGNOSIS — M7989 Other specified soft tissue disorders: Secondary | ICD-10-CM | POA: Diagnosis not present

## 2018-04-02 DIAGNOSIS — M79604 Pain in right leg: Secondary | ICD-10-CM | POA: Diagnosis not present

## 2018-04-02 NOTE — Assessment & Plan Note (Addendum)
Patient presented with right leg swelling for the past two weeks. No trauma except for right knee sprain three weeks prior. Patient has a history of baker's cyst which most likely ruptured and caused swelling. However, given presentation and impending trip would like to rule out possible DVTs. Patient has no red flags  concerning for DVTs and current medical problems are well managed. Patient is low risk for DVT with a Wells score of -1. Will order a D-dimer and follow up on results. If positive, patient know she will need to go to the ED for R LE U/S for definitive diagnosis. Patient agrees with plan and has verbalized understanding. --Order Stat d-dimer, Family medicine Teaching Service upper level know to follow up on the results and will contact Dr. Micheline Chapman. --For LE swelling patient will wear compression sleeve as needed. Expect swelling to gradually improve under the assumption that it is secondary to a ruptured baker cyst. Follow as needed in clinic if symptoms persists.

## 2018-04-02 NOTE — ED Notes (Signed)
ED Provider at bedside. 

## 2018-04-02 NOTE — ED Provider Notes (Signed)
[facility] EMERGENCY DEPARTMENT Provider Note   CSN: [id number] Arrival date & time: 04/02/18  2029     History   Chief Complaint Chief Complaint  Patient presents with  . Leg Pain    HPI Anita Barnett is a 57 y.o. female.  Patient presents for evaluation of right leg pain and swelling.  Symptoms began approximately 36 hours ago.  Patient saw her orthopedic surgeon today and was referred here to be evaluated for possible DVT.  She reports diffuse swelling from the knee down of the right leg with pain in the area of the calf and posterior knee.  She denies injury.  Pain worsens with walking.     Past Medical History:  Diagnosis Date  . Colon polyps   . History of shingles   . Hx of varicella   . Idiopathic parathyroidism ([place])   . Seasonal allergies     Patient Active Problem List   Diagnosis Date Noted  . Right leg swelling 04/02/2018  . Degenerative tear of lateral meniscus of left knee 05/18/2017  . Hyperparathyroidism ([place]) 02/09/2015  . Abnormal LFTs 02/04/2015  . Nasal sore 02/04/2015  . Hypercalcemia 02/04/2015  . Pain of left great toe 09/24/2014    Past Surgical History:  Procedure Laterality Date  . PARATHYROIDECTOMY N/A 11/23/2015   Procedure: LEFT INFERIOR PARATHYROIDECTOMY;  Surgeon: Armandina Gemma, MD;  Location: [facility];  Service: General;  Laterality: N/A;  . TOE SURGERY Left 1996   pin in little toe     OB History    Gravida  2   Para  2   Term  2   Preterm  0   AB  0   Living  2     SAB  0   TAB  0   Ectopic  0   Multiple  0   Live Births  2            Home Medications    Prior to Admission medications   Not on File    Family History Family History  Problem Relation Age of Onset  . COPD Mother        smoker  . Heart disease Father        CABG x 3  . Diabetes Maternal Grandfather        type 1    Social History Social History   Tobacco Use  . Smoking status: Never Smoker  . Smokeless tobacco:  Never Used  Substance Use Topics  . Alcohol use: Yes    Alcohol/week: 1.2 - 1.8 oz    Types: 2 - 3 Cans of beer per week  . Drug use: No     Allergies   Patient has no known allergies.   Review of Systems Review of Systems  Musculoskeletal:       Leg pain  All other systems reviewed and are negative.    Physical Exam Updated Vital Signs BP 124/85   Pulse 78   Temp 98.6 F (37 C) (Oral)   Resp 20   Ht 5\' 7"  (1.702 m)   Wt 81.6 kg (180 lb)   LMP 12/26/2009 (Approximate)   SpO2 98%   BMI 28.19 kg/m   Physical Exam  Constitutional: She is oriented to person, place, and time. She appears well-developed and well-nourished. No distress.  HENT:  Head: Normocephalic and atraumatic.  Right Ear: Hearing normal.  Left Ear: Hearing normal.  Nose: Nose normal.  Mouth/Throat: Oropharynx is clear  and moist and mucous membranes are normal.  Eyes: Pupils are equal, round, and reactive to light. Conjunctivae and EOM are normal.  Neck: Normal range of motion. Neck supple.  Cardiovascular: Regular rhythm, S1 normal and S2 normal. Exam reveals no gallop and no friction rub.  No murmur heard. Pulmonary/Chest: Effort normal and breath sounds normal. No respiratory distress. She exhibits no tenderness.  Abdominal: Soft. Normal appearance and bowel sounds are normal. There is no hepatosplenomegaly. There is no tenderness. There is no rebound, no guarding, no tenderness at McBurney's point and negative Murphy's sign. No hernia.  Musculoskeletal: Normal range of motion.       Right lower leg: She exhibits tenderness and swelling.  Neurological: She is alert and oriented to person, place, and time. She has normal strength. No cranial nerve deficit or sensory deficit. Coordination normal. GCS eye subscore is 4. GCS verbal subscore is 5. GCS motor subscore is 6.  Skin: Skin is warm, dry and intact. No rash noted. No cyanosis.  Psychiatric: She has a normal mood and affect. Her speech is normal  and behavior is normal. Thought content normal.  Nursing note and vitals reviewed.    ED Treatments / Results  Labs (all labs ordered are listed, but only abnormal results are displayed) Labs Reviewed - No data to display  EKG None  Radiology US Venous Img Lower Unilateral Right  Result Date: 04/02/2018 CLINICAL DATA:  Right posterior knee pain and calf swelling for 2-3 days. EXAM: RIGHT LOWER EXTREMITY VENOUS DOPPLER ULTRASOUND TECHNIQUE: Gray-scale sonography with graded compression, as well as color Doppler and duplex ultrasound were performed to evaluate the lower extremity deep venous systems from the level of the common femoral vein and including the common femoral, femoral, profunda femoral, popliteal and calf veins including the posterior tibial, peroneal and gastrocnemius veins when visible. The superficial great saphenous vein was also interrogated. Spectral Doppler was utilized to evaluate flow at rest and with distal augmentation maneuvers in the common femoral, femoral and popliteal veins. COMPARISON:  None. FINDINGS: Contralateral Common Femoral Vein: Respiratory phasicity is normal and symmetric with the symptomatic side. No evidence of thrombus. Normal compressibility. Common Femoral Vein: No evidence of thrombus. Normal compressibility, respiratory phasicity and response to augmentation. Saphenofemoral Junction: No evidence of thrombus. Normal compressibility and flow on color Doppler imaging. Profunda Femoral Vein: No evidence of thrombus. Normal compressibility and flow on color Doppler imaging. Femoral Vein: No evidence of thrombus. Normal compressibility, respiratory phasicity and response to augmentation. Popliteal Vein: No evidence of thrombus. Normal compressibility, respiratory phasicity and response to augmentation. Calf Veins: No evidence of thrombus. Normal compressibility and flow on color Doppler imaging. Superficial Great Saphenous Vein: No evidence of thrombus. Normal  compressibility. Venous Reflux:  None. Other Findings: Tracking fluid emanating from the popliteal fossa down the calf compatible with a ruptured popliteal cyst. This fluid collection measures 3.5 x 1.3 x 15.8 cm. Small joint effusion is also present. IMPRESSION: 1. No evidence of deep venous thrombosis of the right lower extremity. 2. Suspect a ruptured popliteal cyst accounting for fluid tracking along the calf emanating from the medial aspect of the right popliteal fossa. This measures 3.5 x 1.3 x 15.8 cm. Electronically Signed   By: Ashley Royalty M.D.   On: 04/02/2018 22:05    Procedures Procedures (including critical care time)  Medications Ordered in ED Medications - No data to display   Initial Impression / Assessment and Plan / ED Course  I have reviewed the triage  vital signs and the nursing notes.  Pertinent labs & imaging results that were available during my care of the patient were reviewed by me and considered in my medical decision making (see chart for details).     Patient with complaints of pain behind her right knee.  She denies trauma.  Orthopedic sent her here for venous duplex.  Study has been performed and there is no evidence of DVT.  Findings are consistent with a ruptured Baker's cyst.  Treat with rest and analgesia including NSAIDs.  Final Clinical Impressions(s) / ED Diagnoses   Final diagnoses:  Baker's cyst of knee, right    ED Discharge Orders    None       Orpah Greek, MD 04/02/18 2315

## 2018-04-02 NOTE — ED Triage Notes (Signed)
Pt has swelling and pain to RLE. Sent for DVT r/o

## 2018-04-02 NOTE — ED Notes (Signed)
Pt was accidentally removed from epic by registration. New chart initiated.

## 2018-04-02 NOTE — ED Triage Notes (Signed)
Swelling and pain to her right lower leg for a day. She had an elevated Ddimer this am in her MD's office. She was told to come here for an [place].

## 2018-04-02 NOTE — Progress Notes (Signed)
Subjective:    Patient ID: Anita Barnett, female    DOB: October 29, 1961, 57 y.o.   MRN: [id number]   CC: Right leg swelling and tightness  HPI: Patient is a 57 yo female who presents today complaining about right calf swelling and tightness for the past two weeks. Patient reports initially she had a right knee sprain after overextending her knee playing tennis. She rested for three weeks and pain and swelling resolved. In the past two weeks patient has restarted to play tennis. She has played two times and noted right ankle swelling as well as lower leg swelling and tightness around her calf. Patient denies any significant pain, SOB, chest pain, palpitations. She is has not taken any long trip or is on any estrogen or OCPs regimen. Patient is healthy for her age and is currently on no medications. Patient denies any trauma. Patient is concerned about her leg as she is about to take a trip to [place] tomorrow morning  And would like to make sure she does not have a blood clot.  Smoking status reviewed   ROS: all other systems were reviewed and are negative other than in the HPI   Past Medical History:  Diagnosis Date  . Colon polyps   . History of shingles   . Hx of varicella   . Idiopathic parathyroidism ([place])   . Seasonal allergies     Past Surgical History:  Procedure Laterality Date  . PARATHYROIDECTOMY N/A 11/23/2015   Procedure: LEFT INFERIOR PARATHYROIDECTOMY;  Surgeon: Armandina Gemma, MD;  Location: [facility];  Service: General;  Laterality: N/A;  . TOE SURGERY Left 1996   pin in little toe    Past medical history, surgical, family, and social history reviewed and updated in the EMR as appropriate.  Objective:  BP 118/82   Ht 5\' 7"  (1.702 m)   Wt 180 lb (81.6 kg)   LMP 12/26/2009 (Approximate)   BMI 28.19 kg/m   Vitals and nursing note reviewed  General: NAD, pleasant, able to participate in exam Right leg exam: LE swelling noted, no erythema. Tight and mildly tender  to palpation. Negative Homan's. No lesion or deformity noted on exam. Patient is able to ambulate without any significant pain.   Assessment & Plan:    Right leg swelling Patient presented with right leg swelling for the past two weeks. No trauma except for right knee sprain three weeks prior. Patient has a history of baker's cyst which most likely ruptured and caused swelling. However, given presentation and impending trip would like to rule out possible DVTs. Patient has no red flags  concerning for DVTs and current medical problems are well managed. Patient is low risk for DVT with a Wells score of -1. Will order a D-dimer and follow up on results. If positive, patient know she will need to go to the ED for R LE U/S for definitive diagnosis. Patient agrees with plan and has verbalized understanding. --Order Stat d-dimer, Family medicine Teaching Service upper level know to follow up on the results and will contact Dr. Micheline Chapman. --For LE swelling patient will wear compression sleeve as needed. Expect swelling to gradually improve under the assumption that it is secondary to a ruptured baker cyst. Follow as needed in clinic if symptoms persists.    Marjie Skiff, MD [place] PGY-2  Patient seen and evaluated with the resident. I agree with the above plan of care. D-dimer did come back positive and patient was instructed  to proceed to the emergency room for lower extremity Doppler to rule out DVT. Patient voiced understanding of these instructions.  Addendum: Ultrasound negative for DVT. Positive for ruptured Baker's cyst.

## 2018-04-03 ENCOUNTER — Encounter: Payer: Self-pay | Admitting: Sports Medicine

## 2018-04-03 LAB — D-DIMER, QUANTITATIVE (NOT AT ARMC): D-DIMER: 0.7 mg{FEU}/L — AB (ref 0.00–0.49)

## 2018-04-17 ENCOUNTER — Ambulatory Visit (INDEPENDENT_AMBULATORY_CARE_PROVIDER_SITE_OTHER): Payer: 59 | Admitting: Sports Medicine

## 2018-04-17 VITALS — BP 122/90 | Ht 67.0 in | Wt 185.0 lb

## 2018-04-17 DIAGNOSIS — M660 Rupture of popliteal cyst: Secondary | ICD-10-CM

## 2018-04-18 NOTE — Progress Notes (Signed)
   Subjective:    Patient ID: Anita Barnett, female    DOB: [birth year], 57 y.o.   MRN: [id number]  HPI   Anita Barnett comes in today for follow-up on a ruptured right Baker's cyst. She was last seen in the office on April 8. A d-dimer was ordered which was positive and a subsequent Doppler was negative for DVT but did show a ruptured Baker's cyst as the source of her right lower leg swelling. She then left for vacation and comes in today for follow-up. Overall, she is improving. Her swelling is subsiding but she is concerned about the residual swelling. She endorses a feeling of tightness in her leg due to the swelling but she denies any knee pain. She has been able to ambulate fairly comfortably. She has been wearing her compression sock. She has questions about when she can get back to activity such as tennis.    Review of Systems    as above Objective:   Physical Exam  Well-developed, well-nourished. No acute distress. Awake alert and oriented 3. Vital signs reviewed  Right knee: Full range of motion. No effusion. No palpable Baker's cyst. No tenderness to palpation.  Right lower leg: There is some mild diffuse residual swelling from the proximal calf down to the ankle.It has improved since her April 8 visit. Good pulses. Negative Homans. Neurovascularly intact distally. Walking without a limp.      Assessment & Plan:   Residual right lower leg swelling secondary to a ruptured Baker's cyst  Patient is reassured that the swelling should subside over the next 2-3 weeks. I've encouraged her to continue with her compression sock during the day and I think she is okay to resume activity as tolerated. If swelling persists or worsens then she will return to the office for reevaluation.Follow-up for ongoing or recalcitrant issues.

## 2018-06-14 ENCOUNTER — Encounter: Payer: Self-pay | Admitting: Internal Medicine

## 2018-06-14 ENCOUNTER — Ambulatory Visit (INDEPENDENT_AMBULATORY_CARE_PROVIDER_SITE_OTHER): Payer: 59 | Admitting: Internal Medicine

## 2018-06-14 VITALS — BP 105/76 | HR 95 | Temp 99.4°F | Wt 196.0 lb

## 2018-06-14 DIAGNOSIS — J039 Acute tonsillitis, unspecified: Secondary | ICD-10-CM

## 2018-06-14 DIAGNOSIS — J029 Acute pharyngitis, unspecified: Secondary | ICD-10-CM | POA: Diagnosis not present

## 2018-06-14 LAB — POCT RAPID STREP A (OFFICE): RAPID STREP A SCREEN: NEGATIVE

## 2018-06-14 MED ORDER — AMOXICILLIN-POT CLAVULANATE 875-125 MG PO TABS: 1.0000 | ORAL_TABLET | Freq: Two times a day (BID) | ORAL | 0 refills | Status: AC

## 2018-06-14 NOTE — Progress Notes (Signed)
Chief Complaint  Patient presents with  . Sore Throat    HPI: Anita Barnett 57 y.o.    sda   On Tuesday  After helping daughter use  And was tired  And then throat pain  36 hours    ( played tennis ) taking advil and was bad. And awake  And  hard to talk.  eaitng  Difficult.  To swallow  No drooling  . ears huts a tad .   No hx of same of exposures .  No cough   Low grade fever 99.4 .  ROS: See pertinent positives and negatives per HPI. No cp sob vomiting rash   Past Medical History:  Diagnosis Date  . Colon polyps   . History of shingles   . Hx of varicella   . Idiopathic parathyroidism ([place])   . Seasonal allergies     Family History  Problem Relation Age of Onset  . COPD Mother        smoker  . Heart disease Father        CABG x 3  . Diabetes Maternal Grandfather        type 1    Social History   Socioeconomic History  . Marital status: Married    Spouse name: Not on file  . Number of children: 2  . Years of education: Not on file  . Highest education level: Not on file  Occupational History  . Occupation: cpa  Social Needs  . Financial resource strain: Not on file  . Food insecurity:    Worry: Not on file    Inability: Not on file  . Transportation needs:    Medical: Not on file    Non-medical: Not on file  Tobacco Use  . Smoking status: Never Smoker  . Smokeless tobacco: Never Used  Substance and Sexual Activity  . Alcohol use: Yes    Alcohol/week: 1.2 - 1.8 oz    Types: 2 - 3 Cans of beer per week  . Drug use: No  . Sexual activity: Yes    Partners: Male    Birth control/protection: Post-menopausal  Lifestyle  . Physical activity:    Days per week: Not on file    Minutes per session: Not on file  . Stress: Not on file  Relationships  . Social connections:    Talks on phone: Not on file    Gets together: Not on file    Attends religious service: Not on file    Active member of club or organization: Not on file    Attends meetings of clubs  or organizations: Not on file    Relationship status: Not on file  Other Topics Concern  . 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    Lives with her husband.    Has 2 children in college.   G2P2   nneg ets fa    No outpatient medications prior to visit.   No facility-administered medications prior to visit.      EXAM:  BP 105/76   Pulse 95   Temp 99.4 F (37.4 C)   Wt 196 lb (88.9 kg)   LMP 12/26/2009 (Approximate)   BMI 30.70 kg/m   Body mass index is 30.7 kg/m.  GENERAL: vitals reviewed and listed above, alert, oriented, appears well hydrated and in no acute  distress nl speech  No toxic  HEENT: atraumatic, conjunctiva  clear, no obvious abnormalities on inspection of external nose and ears  Tm clear   OP : tonsil  r 2+  Left  1 +  No exudate but very swollen   Speech nl and no drooling  NECK: no obvious masses on inspection tender 1 +  Ac  Neg pc  LUNGS: clear to auscultation bilaterally, no wheezes, rales or rhonchi, good air movement Abdomen:  Sof,t normal bowel sounds without hepatosplenomegaly, no guarding rebound or masses no CVA tenderness CV: HRRR, no clubbing cyanosis or  peripheral edema nl cap refill  MS: moves all extremities without noticeable focal  abnormality PSYCH: pleasant and cooperative, no obvious depression or anxiety  ASSESSMENT AND PLAN:  Discussed the following assessment and plan:  Acute tonsillitis, unspecified etiology  Sore throat - Plan: POC Rapid Strep A, Culture, Group A Strep Impressive assymmetric tonsillitis   r more than left   Neg rs  Empiric  Extended spectrum antibiotic and close fu    rov next week .  -Patient advised to return or notify health care team  if symptoms worsen ,persist or new concerns arise.  Patient Instructions  You have tonsillitis and the right is worse than the left side . This  Often is a bacterial infection   And  beginning antibiotic .  Gargles advil .    Fluids .  If  Not improving in 48 hours or worse need  Contact medical team ( there is a Saturday clinic at [place] from 9-  1  But have to call  . On call  Team or [place] for this .    Make rov for next week  To recheck   he throat .     Tonsillitis Tonsillitis is an infection of the throat that causes the tonsils to become red, tender, and swollen. Tonsils are collections of lymphoid tissue at the back of the throat. Each tonsil has crevices (crypts). Tonsils help fight nose and throat infections and keep infection from spreading to other parts of the body for the first 18 months of life. What are the causes? Sudden (acute) tonsillitis is usually caused by infection with streptococcal bacteria. Long-lasting (chronic) tonsillitis occurs when the crypts of the tonsils become filled with pieces of food and bacteria, which makes it easy for the tonsils to become repeatedly infected. What are the signs or symptoms? Symptoms of tonsillitis include:  A sore throat, with possible difficulty swallowing.  White patches on the tonsils.  Fever.  Tiredness.  New episodes of snoring during sleep, when you did not snore before.  Small, foul-smelling, yellowish-white pieces of material (tonsilloliths) that you occasionally cough up or spit out. The tonsilloliths can also cause you to have bad breath.  How is this diagnosed? Tonsillitis can be diagnosed through a physical exam. Diagnosis can be confirmed with the results of lab tests, including a throat culture. How is this treated? The goals of tonsillitis treatment include the reduction of the severity and duration of symptoms and prevention of associated conditions. Symptoms of tonsillitis can be improved with the use of steroids to reduce the swelling. Tonsillitis caused by bacteria can be treated with antibiotic medicines. Usually, treatment with antibiotic medicines is started before the cause of the  tonsillitis is known. However, if it is determined that the cause is not bacterial, antibiotic medicines will not treat the tonsillitis. If attacks of tonsillitis are severe and frequent, your health care provider  may recommend surgery to remove the tonsils (tonsillectomy). Follow these instructions at home:  Rest as much as possible and get plenty of sleep.  Drink plenty of fluids. While the throat is very sore, eat soft foods or liquids, such as sherbet, soups, or instant breakfast drinks.  Eat frozen ice pops.  Gargle with a warm or cold liquid to help soothe the throat. Mix 1/4 teaspoon of salt and 1/4 teaspoon of baking soda in 8 oz of water. Contact a health care provider if:  Large, tender lumps develop in your neck.  A rash develops.  A green, yellow-brown, or bloody substance is coughed up.  You are unable to swallow liquids or food for 24 hours.  You notice that only one of the tonsils is swollen. Get help right away if:  You develop any new symptoms such as vomiting, severe headache, stiff neck, chest pain, or trouble breathing or swallowing.  You have severe throat pain along with drooling or voice changes.  You have severe pain, unrelieved with recommended medications.  You are unable to fully open the mouth.  You develop redness, swelling, or severe pain anywhere in the neck.  You have a fever. This information is not intended to replace advice given to you by your health care provider. Make sure you discuss any questions you have with your health care provider. Document Released: 09/21/2005 Document Revised: 05/19/2016 Document Reviewed: 05/31/2013 Elsevier Interactive Patient Education  2017 [place] K. Panosh M.D.

## 2018-06-14 NOTE — Patient Instructions (Addendum)
You have tonsillitis and the right is worse than the left side . This  Often is a bacterial infection   And beginning antibiotic .  Gargles advil .    Fluids .  If  Not improving in 48 hours or worse need  Contact medical team ( there is a Saturday clinic at [place] from 9-  1  But have to call  . On call  Team or [place] for this .    Make rov for next week  To recheck   he throat .     Tonsillitis Tonsillitis is an infection of the throat that causes the tonsils to become red, tender, and swollen. Tonsils are collections of lymphoid tissue at the back of the throat. Each tonsil has crevices (crypts). Tonsils help fight nose and throat infections and keep infection from spreading to other parts of the body for the first 18 months of life. What are the causes? Sudden (acute) tonsillitis is usually caused by infection with streptococcal bacteria. Long-lasting (chronic) tonsillitis occurs when the crypts of the tonsils become filled with pieces of food and bacteria, which makes it easy for the tonsils to become repeatedly infected. What are the signs or symptoms? Symptoms of tonsillitis include:  A sore throat, with possible difficulty swallowing.  White patches on the tonsils.  Fever.  Tiredness.  New episodes of snoring during sleep, when you did not snore before.  Small, foul-smelling, yellowish-white pieces of material (tonsilloliths) that you occasionally cough up or spit out. The tonsilloliths can also cause you to have bad breath.  How is this diagnosed? Tonsillitis can be diagnosed through a physical exam. Diagnosis can be confirmed with the results of lab tests, including a throat culture. How is this treated? The goals of tonsillitis treatment include the reduction of the severity and duration of symptoms and prevention of associated conditions. Symptoms of tonsillitis can be improved with the use of steroids to reduce the swelling. Tonsillitis caused by bacteria can be treated with  antibiotic medicines. Usually, treatment with antibiotic medicines is started before the cause of the tonsillitis is known. However, if it is determined that the cause is not bacterial, antibiotic medicines will not treat the tonsillitis. If attacks of tonsillitis are severe and frequent, your health care provider may recommend surgery to remove the tonsils (tonsillectomy). Follow these instructions at home:  Rest as much as possible and get plenty of sleep.  Drink plenty of fluids. While the throat is very sore, eat soft foods or liquids, such as sherbet, soups, or instant breakfast drinks.  Eat frozen ice pops.  Gargle with a warm or cold liquid to help soothe the throat. Mix 1/4 teaspoon of salt and 1/4 teaspoon of baking soda in 8 oz of water. Contact a health care provider if:  Large, tender lumps develop in your neck.  A rash develops.  A green, yellow-brown, or bloody substance is coughed up.  You are unable to swallow liquids or food for 24 hours.  You notice that only one of the tonsils is swollen. Get help right away if:  You develop any new symptoms such as vomiting, severe headache, stiff neck, chest pain, or trouble breathing or swallowing.  You have severe throat pain along with drooling or voice changes.  You have severe pain, unrelieved with recommended medications.  You are unable to fully open the mouth.  You develop redness, swelling, or severe pain anywhere in the neck.  You have a fever. This information is not  intended to replace advice given to you by your health care provider. Make sure you discuss any questions you have with your health care provider. Document Released: 09/21/2005 Document Revised: 05/19/2016 Document Reviewed: 05/31/2013 Elsevier Interactive Patient Education  2017 [employer].

## 2018-06-16 LAB — CULTURE, GROUP A STREP
MICRO NUMBER: 90739880
SPECIMEN QUALITY:: ADEQUATE

## 2018-06-20 NOTE — Progress Notes (Signed)
Chief Complaint  Patient presents with  . Follow-up    1 week f/u    HPI: Anita Barnett 57 y.o.   1 week of med  About 92% better and noted after about   2 days  Minimal scratchy throat feeling  Dryness .  Still has some snoring and maybe predated the  Illntss?  No hc of tonsillitis when younger by hx  ROS: See pertinent positives and negatives per HPI.  Past Medical History:  Diagnosis Date  . Colon polyps   . History of shingles   . Hx of varicella   . Idiopathic parathyroidism ([place])   . Seasonal allergies     Family History  Problem Relation Age of Onset  . COPD Mother        smoker  . Heart disease Father        CABG x 3  . Diabetes Maternal Grandfather        type 1    Social History   Socioeconomic History  . Marital status: Married    Spouse name: Not on file  . Number of children: 2  . Years of education: Not on file  . Highest education level: Not on file  Occupational History  . Occupation: cpa  Social Needs  . Financial resource strain: Not on file  . Food insecurity:    Worry: Not on file    Inability: Not on file  . Transportation needs:    Medical: Not on file    Non-medical: Not on file  Tobacco Use  . Smoking status: Never Smoker  . Smokeless tobacco: Never Used  Substance and Sexual Activity  . Alcohol use: Yes    Alcohol/week: 1.2 - 1.8 oz    Types: 2 - 3 Cans of beer per week    Comment: occ  . Drug use: No  . Sexual activity: Yes    Partners: Male    Birth control/protection: Post-menopausal  Lifestyle  . Physical activity:    Days per week: Not on file    Minutes per session: Not on file  . Stress: Not on file  Relationships  . Social connections:    Talks on phone: Not on file    Gets together: Not on file    Attends religious service: Not on file    Active member of club or organization: Not on file    Attends meetings of clubs or organizations: Not on file    Relationship status: Not on file  Other Topics Concern    . 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    Lives with her husband.    Has 2 children in college.   G2P2   nneg ets fa    Outpatient Medications Prior to Visit  Medication Sig Dispense Refill  . amoxicillin-clavulanate (AUGMENTIN) 875-125 MG tablet Take 1 tablet by mouth every 12 (twelve) hours for 10 days. 20 tablet 0  . Cholecalciferol (VITAMIN D PO) Take 1 tablet by mouth daily.     No facility-administered medications prior to visit.      EXAM:  BP 114/76 (BP Location: Right Arm, Patient Position: Sitting, Cuff Size: Normal)   Pulse 63   Temp 97.7 F (36.5 C) (Oral)   Ht 5\' 7"  (1.702 m)   Wt 196 lb 2 oz (89 kg)   LMP 12/26/2009 (  Approximate)   SpO2 (!) 63%   BMI 30.72 kg/m   Body mass index is 30.72 kg/m.  GENERAL: vitals reviewed and listed above, alert, oriented, appears well hydrated and in no acute distress nl speech HEENT: atraumatic, conjunctiva  clear, no obvious abnormalities on inspection of external nose and ears OP : no lesion edema or exudate  Tonsil 2+ left more than right but no exudate and ok ariway  NECK: no obvious masses on inspection palpation   [place] ac node right  MS: moves all extremities without noticeable focal  abnormality PSYCH: pleasant and cooperative, no obvious depression or anxiety   ASSESSMENT AND PLAN:  Discussed the following assessment and plan:  Acute tonsillitis, unspecified etiology - inproved  assymmetric still r more than left m - Plan: Basic metabolic panel, CBC with Differential/Platelet, Hepatic function panel, Lipid panel, TSH  lab for  cpx  - Plan: Basic metabolic panel, CBC with Differential/Platelet, Hepatic function panel, Lipid panel, TSH  Enlarged tonsils - Plan: Basic metabolic panel, CBC with Differential/Platelet, Hepatic function panel, Lipid panel, TSH Much improved  Still enlarged   ? If  Baseline was enlarged will  follow  And if relaspsing restart med and consider seeing ent .    Plan cpx and labs and reeval then  -Patient advised to return or notify health care team  if symptoms worsen ,persist or new concerns arise.  Patient Instructions  Anita Barnett the antibiotic  Glad you are doing better  Tonsils still enlarged but  Much better and  Will probably go down in size over time   Make CPX  appt  in about a month  And we can recheck at that time .      Anita Barnett. Anita Barnett M.D.

## 2018-06-21 ENCOUNTER — Ambulatory Visit (INDEPENDENT_AMBULATORY_CARE_PROVIDER_SITE_OTHER): Payer: 59 | Admitting: Internal Medicine

## 2018-06-21 ENCOUNTER — Encounter: Payer: Self-pay | Admitting: Internal Medicine

## 2018-06-21 VITALS — BP 114/76 | HR 63 | Temp 97.7°F | Ht 67.0 in | Wt 196.1 lb

## 2018-06-21 DIAGNOSIS — J351 Hypertrophy of tonsils: Secondary | ICD-10-CM | POA: Diagnosis not present

## 2018-06-21 DIAGNOSIS — Z0000 Encounter for general adult medical examination without abnormal findings: Secondary | ICD-10-CM

## 2018-06-21 DIAGNOSIS — J039 Acute tonsillitis, unspecified: Secondary | ICD-10-CM | POA: Diagnosis not present

## 2018-06-21 NOTE — Patient Instructions (Signed)
Finish the antibiotic  Glad you are doing better  Tonsils still enlarged but  Much better and  Will probably go down in size over time   Make CPX  appt  in about a month  And we can recheck at that time .

## 2018-07-18 ENCOUNTER — Other Ambulatory Visit (INDEPENDENT_AMBULATORY_CARE_PROVIDER_SITE_OTHER): Payer: 59

## 2018-07-18 DIAGNOSIS — J039 Acute tonsillitis, unspecified: Secondary | ICD-10-CM | POA: Diagnosis not present

## 2018-07-18 DIAGNOSIS — Z0000 Encounter for general adult medical examination without abnormal findings: Secondary | ICD-10-CM

## 2018-07-18 DIAGNOSIS — J351 Hypertrophy of tonsils: Secondary | ICD-10-CM | POA: Diagnosis not present

## 2018-07-19 LAB — BASIC METABOLIC PANEL
BUN: 18 mg/dL (ref 6–23)
CALCIUM: 9.2 mg/dL (ref 8.4–10.5)
CO2: 27 mEq/L (ref 19–32)
Chloride: 106 mEq/L (ref 96–112)
Creatinine, Ser: 0.8 mg/dL (ref 0.40–1.20)
GFR: 78.67 mL/min (ref >60.00)
Glucose, Bld: 92 mg/dL (ref 70–99)
Potassium: 4.4 mEq/L (ref 3.5–5.1)
Sodium: 141 mEq/L (ref 135–145)

## 2018-07-19 LAB — TSH: TSH: 3.97 u[IU]/mL (ref 0.35–4.50)

## 2018-07-19 LAB — CBC WITH DIFFERENTIAL/PLATELET
BASOS PCT: 0.9 % (ref 0.0–3.0)
Basophils Absolute: 0.1 10*3/uL (ref 0.0–0.1)
EOS PCT: 1.8 % (ref 0.0–5.0)
Eosinophils Absolute: 0.1 10*3/uL (ref 0.0–0.7)
HCT: 38 % (ref 36.0–46.0)
Hemoglobin: 12.8 g/dL (ref 12.0–15.0)
LYMPHS ABS: 2.2 10*3/uL (ref 0.7–4.0)
Lymphocytes Relative: 34.1 % (ref 12.0–46.0)
MCHC: 33.8 g/dL (ref 30.0–36.0)
MCV: 90.7 fl (ref 78.0–100.0)
Monocytes Absolute: 0.6 10*3/uL (ref 0.1–1.0)
Monocytes Relative: 9.8 % (ref 3.0–12.0)
Neutro Abs: 3.5 10*3/uL (ref 1.4–7.7)
Neutrophils Relative %: 53.4 % (ref 43.0–77.0)
PLATELETS: 333 10*3/uL (ref 150.0–400.0)
RBC: 4.19 Mil/uL (ref 3.87–5.11)
RDW: 14 % (ref 11.5–15.5)
WBC: 6.6 10*3/uL (ref 4.0–10.5)

## 2018-07-19 LAB — HEPATIC FUNCTION PANEL
ALBUMIN: 4.2 g/dL (ref 3.5–5.2)
ALT: 22 U/L (ref 0–35)
AST: 19 U/L (ref 0–37)
Alkaline Phosphatase: 81 U/L (ref 39–117)
Bilirubin, Direct: 0.2 mg/dL (ref 0.0–0.3)
Total Bilirubin: 0.7 mg/dL (ref 0.2–1.2)
Total Protein: 7.3 g/dL (ref 6.0–8.3)

## 2018-07-19 LAB — LIPID PANEL
CHOL/HDL RATIO: 3
Cholesterol: 154 mg/dL (ref 0–200)
HDL: 50.2 mg/dL (ref >39.00)
LDL Cholesterol: 92 mg/dL (ref 0–99)
NONHDL: 103.58
Triglycerides: 60 mg/dL (ref 0.0–149.0)
VLDL: 12 mg/dL (ref 0.0–40.0)

## 2018-07-26 NOTE — Progress Notes (Signed)
Chief Complaint  Patient presents with  . Annual Exam    recheck tonsils // Discuss Vit D    HPI: Patient  Anita Barnett  57 y.o. comes in today for Preventive Health Care visit   Check tonsils : no throat pain per se but dose feel full on right    Tickle like cough  Throat clearing but no fever  Weigh t loss   ocass hb but no water brash  No exercise intolerance unintended weight loss  Not taking  Vit d   Had lower levels in past  Sees gyne and dermatology   Health Maintenance  Topic Date Due  . PAP SMEAR  01/15/2018  . INFLUENZA VACCINE  07/26/2018  . MAMMOGRAM  11/30/2019  . COLONOSCOPY  12/09/2024  . TETANUS/TDAP  01/15/2025  . Hepatitis C Screening  Completed  . HIV Screening  Completed   Health Maintenance Review LIFESTYLE:  Exercise:   Hot yoga  3-5 per week tennis  . Weights  Walk   Tobacco/ETS:  no Alcohol:   2-5 per week Sugar beverages:  no Sleep: 8  Drug use: no HH of   2+   Cats  3 Work:20 hours per week .   ROS:  GEN/ HEENT: No fever, significant weight changes sweats headaches vision problems hearing changes, CV/ PULM; No chest pain shortness of breath cough, syncope,edema  change in exercise tolerance. GI /GU: No adominal pain, vomiting, change in bowel habits. No blood in the stool. No significant GU symptoms. SKIN/HEME: ,no acute skin rashes suspicious lesions or bleeding. No lymphadenopathy, nodules, masses.  NEURO/ PSYCH:  No neurologic signs such as weakness numbness. No depression anxiety. IMM/ Allergy: No unusual infections.  Allergy .   REST of 12 system review negative except as per HPI   Past Medical History:  Diagnosis Date  . Colon polyps   . History of shingles   . Hx of varicella   . Idiopathic parathyroidism ([place])   . Seasonal allergies     Past Surgical History:  Procedure Laterality Date  . PARATHYROIDECTOMY N/A 11/23/2015   Procedure: LEFT INFERIOR PARATHYROIDECTOMY;  Surgeon: Armandina Gemma, MD;  Location: [facility];  Service:  General;  Laterality: N/A;  . TOE SURGERY Left 1996   pin in little toe    Family History  Problem Relation Age of Onset  . COPD Mother        smoker  . Heart disease Father        CABG x 3  . Diabetes Maternal Grandfather        type 1    Social History   Socioeconomic History  . Marital status: Married    Spouse name: Not on file  . Number of children: 2  . Years of education: Not on file  . Highest education level: Not on file  Occupational History  . Occupation: cpa  Social Needs  . Financial resource strain: Not on file  . Food insecurity:    Worry: Not on file    Inability: Not on file  . Transportation needs:    Medical: Not on file    Non-medical: Not on file  Tobacco Use  . Smoking status: Never Smoker  . Smokeless tobacco: Never Used  Substance and Sexual Activity  . Alcohol use: Yes    Alcohol/week: 1.2 - 1.8 oz    Types: 2 - 3 Cans of beer per week    Comment: occ  . Drug use: No  .  Sexual activity: Yes    Partners: Male    Birth control/protection: Post-menopausal  Lifestyle  . Physical activity:    Days per week: Not on file    Minutes per session: Not on file  . Stress: Not on file  Relationships  . Social connections:    Talks on phone: Not on file    Gets together: Not on file    Attends religious service: Not on file    Active member of club or organization: Not on file    Attends meetings of clubs or organizations: Not on file    Relationship status: Not on file  Other Topics Concern  . 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    Lives with her husband.    Has 2 children in college.   G2P2   nneg ets fa    Outpatient Medications Prior to Visit  Medication Sig Dispense Refill  . Cholecalciferol (VITAMIN D PO) Take 1 tablet by mouth daily.     No facility-administered medications prior to visit.      EXAM:  BP 102/62 (BP Location: Right  Arm, Patient Position: Sitting, Cuff Size: Normal)   Pulse 64   Temp 97.9 F (36.6 C) (Oral)   Ht 5' 6.5" (1.689 m)   Wt 196 lb 11.2 oz (89.2 kg)   LMP 12/26/2009 (Approximate)   BMI 31.27 kg/m   Body mass index is 31.27 kg/m. Wt Readings from Last 3 Encounters:  07/27/18 196 lb 11.2 oz (89.2 kg)  06/21/18 196 lb 2 oz (89 kg)  06/14/18 196 lb (88.9 kg)    Physical Exam: Vital signs reviewed IDP:OEUM is a well-developed well-nourished alert cooperative    who appearsr stated age in no acute distress.  HEENT: normocephalic atraumatic , Eyes: PERRL EOM's full, conjunctiva clear, Nares: paten,t no deformity discharge or tenderness., Ears: no deformity EAC's clear TMs with normal landmarks. Mouth: clear OP, no lesions, edema.  Tonsils 2+ right larger than left 1+  No exudate   Moist mucous membranes. Dentition in adequate repair. NECK: supple without masses, thyromegaly or bruits. Shoddy nodes  CHEST/PULM:  Clear to auscultation and percussion breath sounds equal no wheeze , rales or rhonchi. No chest wall deformities or tenderness. Breast: normal by inspection . No dimpling, discharge, masses, tenderness or discharge . CV: PMI is nondisplaced, S1 S2 no gallops, murmurs, rubs. Peripheral pulses are full without delay.No JVD .  ABDOMEN: Bowel sounds normal nontender  No guard or rebound, no hepato splenomegal no CVA tenderness.   Extremtities:  No clubbing cyanosis or edema, no acute joint swelling or redness no focal atrophy NEURO:  Oriented x3, cranial nerves 3-12 appear to be intact, no obvious focal weakness,gait within normal limits no abnormal reflexes or asymmetrical SKIN: No acute rashes normal turgor, color, no bruising or petechiae. Sun changes  PSYCH: Oriented, good eye contact, no obvious depression anxiety, cognition and judgment appear normal. LN: no cervical axillary inguinal adenopathy  Lab Results  Component Value Date   WBC 6.6 07/19/2018   HGB 12.8 07/19/2018   HCT  38.0 07/19/2018   PLT 333.0 07/19/2018   GLUCOSE 92 07/19/2018   CHOL 154 07/19/2018   TRIG 60.0 07/19/2018   HDL 50.20 07/19/2018   LDLCALC 92 07/19/2018   ALT 22 07/19/2018   AST 19 07/19/2018   NA 141 07/19/2018  K 4.4 07/19/2018   CL 106 07/19/2018   CREATININE 0.80 07/19/2018   BUN 18 07/19/2018   CO2 27 07/19/2018   TSH 3.97 07/19/2018    BP Readings from Last 3 Encounters:  07/27/18 102/62  06/21/18 114/76  06/14/18 105/76   ASSESSMENT AND PLAN:  Discussed the following assessment and plan:  Visit for preventive health examination  Enlarged tonsil - Plan: Ambulatory referral to ENT  Throat clearing - Plan: Ambulatory referral to ENT uncertain if asymmetry tonsil is  clinically significant   Throat sx could be  Reflux other exam unrevealing Plan ent  Check for good exam and opinion ? If significant findings   Patient Care Team: Panosh, Standley Brooking, MD as PCP - General (Internal Medicine) Kem Boroughs, FNP as Nurse Practitioner (Nurse Practitioner) Juanita Craver, MD as Consulting Physician (Gastroenterology) Lavonna Monarch, MD as Consulting Physician (Dermatology) Patient Instructions  Your blood tests are all normal . Take vit d 800 - 1000 iu pre day in supplement or food .  Get  Your  Gyne check .   Continue lifestyle intervention healthy eating and exercise .  Will be contacted about ENT consult to check your tonsils .   Because of  Some assymmetry of the right tonsil area .    sihngrix vaccine when available.    If al ok then  yearly cpx   Health Maintenance, Female Adopting a healthy lifestyle and getting preventive care can go a long way to promote health and wellness. Talk with your health care provider about what schedule of regular examinations is right for you. This is a good chance for you to check in with your provider about disease prevention and staying healthy. In between checkups, there are plenty of things you can do on your own. Experts  have done a lot of research about which lifestyle changes and preventive measures are most likely to keep you healthy. Ask your health care provider for more information. Weight and diet Eat a healthy diet  Be sure to include plenty of vegetables, fruits, low-fat dairy products, and lean protein.  Do not eat a lot of foods high in solid fats, added sugars, or salt.  Get regular exercise. This is one of the most important things you can do for your health. ? Most adults should exercise for at least 150 minutes each week. The exercise should increase your heart rate and make you sweat (moderate-intensity exercise). ? Most adults should also do strengthening exercises at least twice a week. This is in addition to the moderate-intensity exercise.  Maintain a healthy weight  Body mass index (BMI) is a measurement that can be used to identify possible weight problems. It estimates body fat based on height and weight. Your health care provider can help determine your BMI and help you achieve or maintain a healthy weight.  For females 12 years of age and older: ? A BMI below 18.5 is considered underweight. ? A BMI of 18.5 to 24.9 is normal. ? A BMI of 25 to 29.9 is considered overweight. ? A BMI of 30 and above is considered obese.  Watch levels of cholesterol and blood lipids  You should start having your blood tested for lipids and cholesterol at 57 years of age, then have this test every 5 years.  You may need to have your cholesterol levels checked more often if: ? Your lipid or cholesterol levels are high. ? You are older than 57 years of age. ? You are at  high risk for heart disease.  Cancer screening Lung Cancer  Lung cancer screening is recommended for adults 59-15 years old who are at high risk for lung cancer because of a history of smoking.  A yearly low-dose CT scan of the lungs is recommended for people who: ? Currently smoke. ? Have quit within the past 15 years. ? Have  at least a 30-pack-year history of smoking. A pack year is smoking an average of one pack of cigarettes a day for 1 year.  Yearly screening should continue until it has been 15 years since you quit.  Yearly screening should stop if you develop a health problem that would prevent you from having lung cancer treatment.  Breast Cancer  Practice breast self-awareness. This means understanding how your breasts normally appear and feel.  It also means doing regular breast self-exams. Let your health care provider know about any changes, no matter how small.  If you are in your 20s or 30s, you should have a clinical breast exam (CBE) by a health care provider every 1-3 years as part of a regular health exam.  If you are 38 or older, have a CBE every year. Also consider having a breast X-ray (mammogram) every year.  If you have a family history of breast cancer, talk to your health care provider about genetic screening.  If you are at high risk for breast cancer, talk to your health care provider about having an MRI and a mammogram every year.  Breast cancer gene (BRCA) assessment is recommended for women who have family members with BRCA-related cancers. BRCA-related cancers include: ? Breast. ? Ovarian. ? Tubal. ? Peritoneal cancers.  Results of the assessment will determine the need for genetic counseling and BRCA1 and BRCA2 testing.  Cervical Cancer Your health care provider may recommend that you be screened regularly for cancer of the pelvic organs (ovaries, uterus, and vagina). This screening involves a pelvic examination, including checking for microscopic changes to the surface of your cervix (Pap test). You may be encouraged to have this screening done every 3 years, beginning at age 68.  For women ages 18-65, health care providers may recommend pelvic exams and Pap testing every 3 years, or they may recommend the Pap and pelvic exam, combined with testing for human papilloma virus  (HPV), every 5 years. Some types of HPV increase your risk of cervical cancer. Testing for HPV may also be done on women of any age with unclear Pap test results.  Other health care providers may not recommend any screening for nonpregnant women who are considered low risk for pelvic cancer and who do not have symptoms. Ask your health care provider if a screening pelvic exam is right for you.  If you have had past treatment for cervical cancer or a condition that could lead to cancer, you need Pap tests and screening for cancer for at least 20 years after your treatment. If Pap tests have been discontinued, your risk factors (such as having a new sexual partner) need to be reassessed to determine if screening should resume. Some women have medical problems that increase the chance of getting cervical cancer. In these cases, your health care provider may recommend more frequent screening and Pap tests.  Colorectal Cancer  This type of cancer can be detected and often prevented.  Routine colorectal cancer screening usually begins at 57 years of age and continues through 57 years of age.  Your health care provider may recommend screening at an earlier  age if you have risk factors for colon cancer.  Your health care provider may also recommend using home test kits to check for hidden blood in the stool.  A small camera at the end of a tube can be used to examine your colon directly (sigmoidoscopy or colonoscopy). This is done to check for the earliest forms of colorectal cancer.  Routine screening usually begins at age 50.  Direct examination of the colon should be repeated every 5-10 years through 57 years of age. However, you may need to be screened more often if early forms of precancerous polyps or small growths are found.  Skin Cancer  Check your skin from head to toe regularly.  Tell your health care provider about any new moles or changes in moles, especially if there is a change in a  mole's shape or color.  Also tell your health care provider if you have a mole that is larger than the size of a pencil eraser.  Always use sunscreen. Apply sunscreen liberally and repeatedly throughout the day.  Protect yourself by wearing long sleeves, pants, a wide-brimmed hat, and sunglasses whenever you are outside.  Heart disease, diabetes, and high blood pressure  High blood pressure causes heart disease and increases the risk of stroke. High blood pressure is more likely to develop in: ? People who have blood pressure in the high end of the normal range (130-139/85-89 mm Hg). ? People who are overweight or obese. ? People who are African American.  If you are 28-62 years of age, have your blood pressure checked every 3-5 years. If you are 96 years of age or older, have your blood pressure checked every year. You should have your blood pressure measured twice-once when you are at a hospital or clinic, and once when you are not at a hospital or clinic. Record the average of the two measurements. To check your blood pressure when you are not at a hospital or clinic, you can use: ? An automated blood pressure machine at a pharmacy. ? A home blood pressure monitor.  If you are between 27 years and 41 years old, ask your health care provider if you should take aspirin to prevent strokes.  Have regular diabetes screenings. This involves taking a blood sample to check your fasting blood sugar level. ? If you are at a normal weight and have a low risk for diabetes, have this test once every three years after 57 years of age. ? If you are overweight and have a high risk for diabetes, consider being tested at a younger age or more often. Preventing infection Hepatitis B  If you have a higher risk for hepatitis B, you should be screened for this virus. You are considered at high risk for hepatitis B if: ? You were born in a country where hepatitis B is common. Ask your health care provider  which countries are considered high risk. ? Your parents were born in a high-risk country, and you have not been immunized against hepatitis B (hepatitis B vaccine). ? You have HIV or AIDS. ? You use needles to inject street drugs. ? You live with someone who has hepatitis B. ? You have had sex with someone who has hepatitis B. ? You get hemodialysis treatment. ? You take certain medicines for conditions, including cancer, organ transplantation, and autoimmune conditions.  Hepatitis C  Blood testing is recommended for: ? Everyone born from [birth year] through 1965. ? Anyone with known risk factors for hepatitis C.  Sexually transmitted infections (STIs)  You should be screened for sexually transmitted infections (STIs) including gonorrhea and chlamydia if: ? You are sexually active and are younger than 57 years of age. ? You are older than 57 years of age and your health care provider tells you that you are at risk for this type of infection. ? Your sexual activity has changed since you were last screened and you are at an increased risk for chlamydia or gonorrhea. Ask your health care provider if you are at risk.  If you do not have HIV, but are at risk, it may be recommended that you take a prescription medicine daily to prevent HIV infection. This is called pre-exposure prophylaxis (PrEP). You are considered at risk if: ? You are sexually active and do not regularly use condoms or know the HIV status of your partner(s). ? You take drugs by injection. ? You are sexually active with a partner who has HIV.  Talk with your health care provider about whether you are at high risk of being infected with HIV. If you choose to begin PrEP, you should first be tested for HIV. You should then be tested every 3 months for as long as you are taking PrEP. Pregnancy  If you are premenopausal and you may become pregnant, ask your health care provider about preconception counseling.  If you may become  pregnant, take 400 to 800 micrograms (mcg) of folic acid every day.  If you want to prevent pregnancy, talk to your health care provider about birth control (contraception). Osteoporosis and menopause  Osteoporosis is a disease in which the bones lose minerals and strength with aging. This can result in serious bone fractures. Your risk for osteoporosis can be identified using a bone density scan.  If you are 23 years of age or older, or if you are at risk for osteoporosis and fractures, ask your health care provider if you should be screened.  Ask your health care provider whether you should take a calcium or vitamin D supplement to lower your risk for osteoporosis.  Menopause may have certain physical symptoms and risks.  Hormone replacement therapy may reduce some of these symptoms and risks. Talk to your health care provider about whether hormone replacement therapy is right for you. Follow these instructions at home:  Schedule regular health, dental, and eye exams.  Stay current with your immunizations.  Do not use any tobacco products including cigarettes, chewing tobacco, or electronic cigarettes.  If you are pregnant, do not drink alcohol.  If you are breastfeeding, limit how much and how often you drink alcohol.  Limit alcohol intake to no more than 1 drink per day for nonpregnant women. One drink equals 12 ounces of beer, 5 ounces of wine, or 1 ounces of hard liquor.  Do not use street drugs.  Do not share needles.  Ask your health care provider for help if you need support or information about quitting drugs.  Tell your health care provider if you often feel depressed.  Tell your health care provider if you have ever been abused or do not feel safe at home. This information is not intended to replace advice given to you by your health care provider. Make sure you discuss any questions you have with your health care provider. Document Released: 06/27/2011 Document  Revised: 05/19/2016 Document Reviewed: 09/15/2015 Elsevier Interactive Patient Education  2018 [place]. Panosh M.D.

## 2018-07-27 ENCOUNTER — Encounter: Payer: Self-pay | Admitting: Internal Medicine

## 2018-07-27 ENCOUNTER — Ambulatory Visit (INDEPENDENT_AMBULATORY_CARE_PROVIDER_SITE_OTHER): Payer: 59 | Admitting: Internal Medicine

## 2018-07-27 VITALS — BP 102/62 | HR 64 | Temp 97.9°F | Ht 66.5 in | Wt 196.7 lb

## 2018-07-27 DIAGNOSIS — R6889 Other general symptoms and signs: Secondary | ICD-10-CM | POA: Diagnosis not present

## 2018-07-27 DIAGNOSIS — R0989 Other specified symptoms and signs involving the circulatory and respiratory systems: Secondary | ICD-10-CM

## 2018-07-27 DIAGNOSIS — Z0000 Encounter for general adult medical examination without abnormal findings: Secondary | ICD-10-CM

## 2018-07-27 DIAGNOSIS — J351 Hypertrophy of tonsils: Secondary | ICD-10-CM | POA: Diagnosis not present

## 2018-07-27 NOTE — Patient Instructions (Signed)
Your blood tests are all normal . Take vit d 800 - 1000 iu pre day in supplement or food .  Get  Your  Gyne check .   Continue lifestyle intervention healthy eating and exercise .  Will be contacted about ENT consult to check your tonsils .   Because of  Some assymmetry of the right tonsil area .    sihngrix vaccine when available.    If al ok then  yearly cpx   Health Maintenance, Female Adopting a healthy lifestyle and getting preventive care can go a long way to promote health and wellness. Talk with your health care provider about what schedule of regular examinations is right for you. This is a good chance for you to check in with your provider about disease prevention and staying healthy. In between checkups, there are plenty of things you can do on your own. Experts have done a lot of research about which lifestyle changes and preventive measures are most likely to keep you healthy. Ask your health care provider for more information. Weight and diet Eat a healthy diet  Be sure to include plenty of vegetables, fruits, low-fat dairy products, and lean protein.  Do not eat a lot of foods high in solid fats, added sugars, or salt.  Get regular exercise. This is one of the most important things you can do for your health. ? Most adults should exercise for at least 150 minutes each week. The exercise should increase your heart rate and make you sweat (moderate-intensity exercise). ? Most adults should also do strengthening exercises at least twice a week. This is in addition to the moderate-intensity exercise.  Maintain a healthy weight  Body mass index (BMI) is a measurement that can be used to identify possible weight problems. It estimates body fat based on height and weight. Your health care provider can help determine your BMI and help you achieve or maintain a healthy weight.  For females 60 years of age and older: ? A BMI below 18.5 is considered underweight. ? A BMI of  18.5 to 24.9 is normal. ? A BMI of 25 to 29.9 is considered overweight. ? A BMI of 30 and above is considered obese.  Watch levels of cholesterol and blood lipids  You should start having your blood tested for lipids and cholesterol at 57 years of age, then have this test every 5 years.  You may need to have your cholesterol levels checked more often if: ? Your lipid or cholesterol levels are high. ? You are older than 57 years of age. ? You are at high risk for heart disease.  Cancer screening Lung Cancer  Lung cancer screening is recommended for adults 51-62 years old who are at high risk for lung cancer because of a history of smoking.  A yearly low-dose CT scan of the lungs is recommended for people who: ? Currently smoke. ? Have quit within the past 15 years. ? Have at least a 30-pack-year history of smoking. A pack year is smoking an average of one pack of cigarettes a day for 1 year.  Yearly screening should continue until it has been 15 years since you quit.  Yearly screening should stop if you develop a health problem that would prevent you from having lung cancer treatment.  Breast Cancer  Practice breast self-awareness. This means understanding how your breasts normally appear and feel.  It also means doing regular breast self-exams. Let your health care provider know about any changes,  no matter how small.  If you are in your 20s or 30s, you should have a clinical breast exam (CBE) by a health care provider every 1-3 years as part of a regular health exam.  If you are 10 or older, have a CBE every year. Also consider having a breast X-ray (mammogram) every year.  If you have a family history of breast cancer, talk to your health care provider about genetic screening.  If you are at high risk for breast cancer, talk to your health care provider about having an MRI and a mammogram every year.  Breast cancer gene (BRCA) assessment is recommended for women who have  family members with BRCA-related cancers. BRCA-related cancers include: ? Breast. ? Ovarian. ? Tubal. ? Peritoneal cancers.  Results of the assessment will determine the need for genetic counseling and BRCA1 and BRCA2 testing.  Cervical Cancer Your health care provider may recommend that you be screened regularly for cancer of the pelvic organs (ovaries, uterus, and vagina). This screening involves a pelvic examination, including checking for microscopic changes to the surface of your cervix (Pap test). You may be encouraged to have this screening done every 3 years, beginning at age 36.  For women ages 46-65, health care providers may recommend pelvic exams and Pap testing every 3 years, or they may recommend the Pap and pelvic exam, combined with testing for human papilloma virus (HPV), every 5 years. Some types of HPV increase your risk of cervical cancer. Testing for HPV may also be done on women of any age with unclear Pap test results.  Other health care providers may not recommend any screening for nonpregnant women who are considered low risk for pelvic cancer and who do not have symptoms. Ask your health care provider if a screening pelvic exam is right for you.  If you have had past treatment for cervical cancer or a condition that could lead to cancer, you need Pap tests and screening for cancer for at least 20 years after your treatment. If Pap tests have been discontinued, your risk factors (such as having a new sexual partner) need to be reassessed to determine if screening should resume. Some women have medical problems that increase the chance of getting cervical cancer. In these cases, your health care provider may recommend more frequent screening and Pap tests.  Colorectal Cancer  This type of cancer can be detected and often prevented.  Routine colorectal cancer screening usually begins at 57 years of age and continues through 57 years of age.  Your health care provider may  recommend screening at an earlier age if you have risk factors for colon cancer.  Your health care provider may also recommend using home test kits to check for hidden blood in the stool.  A small camera at the end of a tube can be used to examine your colon directly (sigmoidoscopy or colonoscopy). This is done to check for the earliest forms of colorectal cancer.  Routine screening usually begins at age 78.  Direct examination of the colon should be repeated every 5-10 years through 57 years of age. However, you may need to be screened more often if early forms of precancerous polyps or small growths are found.  Skin Cancer  Check your skin from head to toe regularly.  Tell your health care provider about any new moles or changes in moles, especially if there is a change in a mole's shape or color.  Also tell your health care provider if you  have a mole that is larger than the size of a pencil eraser.  Always use sunscreen. Apply sunscreen liberally and repeatedly throughout the day.  Protect yourself by wearing long sleeves, pants, a wide-brimmed hat, and sunglasses whenever you are outside.  Heart disease, diabetes, and high blood pressure  High blood pressure causes heart disease and increases the risk of stroke. High blood pressure is more likely to develop in: ? People who have blood pressure in the high end of the normal range (130-139/85-89 mm Hg). ? People who are overweight or obese. ? People who are African American.  If you are 59-31 years of age, have your blood pressure checked every 3-5 years. If you are 40 years of age or older, have your blood pressure checked every year. You should have your blood pressure measured twice-once when you are at a hospital or clinic, and once when you are not at a hospital or clinic. Record the average of the two measurements. To check your blood pressure when you are not at a hospital or clinic, you can use: ? An automated blood pressure  machine at a pharmacy. ? A home blood pressure monitor.  If you are between 24 years and 18 years old, ask your health care provider if you should take aspirin to prevent strokes.  Have regular diabetes screenings. This involves taking a blood sample to check your fasting blood sugar level. ? If you are at a normal weight and have a low risk for diabetes, have this test once every three years after 57 years of age. ? If you are overweight and have a high risk for diabetes, consider being tested at a younger age or more often. Preventing infection Hepatitis B  If you have a higher risk for hepatitis B, you should be screened for this virus. You are considered at high risk for hepatitis B if: ? You were born in a country where hepatitis B is common. Ask your health care provider which countries are considered high risk. ? Your parents were born in a high-risk country, and you have not been immunized against hepatitis B (hepatitis B vaccine). ? You have HIV or AIDS. ? You use needles to inject street drugs. ? You live with someone who has hepatitis B. ? You have had sex with someone who has hepatitis B. ? You get hemodialysis treatment. ? You take certain medicines for conditions, including cancer, organ transplantation, and autoimmune conditions.  Hepatitis C  Blood testing is recommended for: ? Everyone born from [birth year] through 1965. ? Anyone with known risk factors for hepatitis C.  Sexually transmitted infections (STIs)  You should be screened for sexually transmitted infections (STIs) including gonorrhea and chlamydia if: ? You are sexually active and are younger than 57 years of age. ? You are older than 57 years of age and your health care provider tells you that you are at risk for this type of infection. ? Your sexual activity has changed since you were last screened and you are at an increased risk for chlamydia or gonorrhea. Ask your health care provider if you are at  risk.  If you do not have HIV, but are at risk, it may be recommended that you take a prescription medicine daily to prevent HIV infection. This is called pre-exposure prophylaxis (PrEP). You are considered at risk if: ? You are sexually active and do not regularly use condoms or know the HIV status of your partner(s). ? You take drugs by injection. ?  You are sexually active with a partner who has HIV.  Talk with your health care provider about whether you are at high risk of being infected with HIV. If you choose to begin PrEP, you should first be tested for HIV. You should then be tested every 3 months for as long as you are taking PrEP. Pregnancy  If you are premenopausal and you may become pregnant, ask your health care provider about preconception counseling.  If you may become pregnant, take 400 to 800 micrograms (mcg) of folic acid every day.  If you want to prevent pregnancy, talk to your health care provider about birth control (contraception). Osteoporosis and menopause  Osteoporosis is a disease in which the bones lose minerals and strength with aging. This can result in serious bone fractures. Your risk for osteoporosis can be identified using a bone density scan.  If you are 49 years of age or older, or if you are at risk for osteoporosis and fractures, ask your health care provider if you should be screened.  Ask your health care provider whether you should take a calcium or vitamin D supplement to lower your risk for osteoporosis.  Menopause may have certain physical symptoms and risks.  Hormone replacement therapy may reduce some of these symptoms and risks. Talk to your health care provider about whether hormone replacement therapy is right for you. Follow these instructions at home:  Schedule regular health, dental, and eye exams.  Stay current with your immunizations.  Do not use any tobacco products including cigarettes, chewing tobacco, or electronic  cigarettes.  If you are pregnant, do not drink alcohol.  If you are breastfeeding, limit how much and how often you drink alcohol.  Limit alcohol intake to no more than 1 drink per day for nonpregnant women. One drink equals 12 ounces of beer, 5 ounces of wine, or 1 ounces of hard liquor.  Do not use street drugs.  Do not share needles.  Ask your health care provider for help if you need support or information about quitting drugs.  Tell your health care provider if you often feel depressed.  Tell your health care provider if you have ever been abused or do not feel safe at home. This information is not intended to replace advice given to you by your health care provider. Make sure you discuss any questions you have with your health care provider. Document Released: 06/27/2011 Document Revised: 05/19/2016 Document Reviewed: 09/15/2015 Elsevier Interactive Patient Education  [employer].

## 2018-07-30 ENCOUNTER — Ambulatory Visit (INDEPENDENT_AMBULATORY_CARE_PROVIDER_SITE_OTHER): Payer: 59 | Admitting: Internal Medicine

## 2018-07-30 ENCOUNTER — Encounter: Payer: Self-pay | Admitting: Internal Medicine

## 2018-07-30 VITALS — BP 102/70 | HR 72 | Temp 98.4°F | Wt 195.8 lb

## 2018-07-30 DIAGNOSIS — J351 Hypertrophy of tonsils: Secondary | ICD-10-CM | POA: Diagnosis not present

## 2018-07-30 DIAGNOSIS — J22 Unspecified acute lower respiratory infection: Secondary | ICD-10-CM

## 2018-07-30 NOTE — Progress Notes (Signed)
Chief Complaint  Patient presents with  . Cough    Hacky cough x 3 days, some production green in color, hoarseness and sore throat. Pt denies fever and chest congestion. Some HA.     HPI: Anita Barnett 57 y.o.   sda     Cough  Hoarse not sore thrat like last time   than last time.   No documented fever  Some cough and congestion .  Som ehruting to swallow  No worse on right.   ROS: See pertinent positives and negatives per HPI.  Past Medical History:  Diagnosis Date  . Colon polyps   . History of shingles   . Hx of varicella   . Idiopathic parathyroidism ([place])   . Seasonal allergies     Family History  Problem Relation Age of Onset  . COPD Mother        smoker  . Heart disease Father        CABG x 3  . Diabetes Maternal Grandfather        type 1    Social History   Socioeconomic History  . Marital status: Married    Spouse name: Not on file  . Number of children: 2  . Years of education: Not on file  . Highest education level: Not on file  Occupational History  . Occupation: cpa  Social Needs  . Financial resource strain: Not on file  . Food insecurity:    Worry: Not on file    Inability: Not on file  . Transportation needs:    Medical: Not on file    Non-medical: Not on file  Tobacco Use  . Smoking status: Never Smoker  . Smokeless tobacco: Never Used  Substance and Sexual Activity  . Alcohol use: Yes    Alcohol/week: 1.2 - 1.8 oz    Types: 2 - 3 Cans of beer per week    Comment: occ  . Drug use: No  . Sexual activity: Yes    Partners: Male    Birth control/protection: Post-menopausal  Lifestyle  . Physical activity:    Days per week: Not on file    Minutes per session: Not on file  . Stress: Not on file  Relationships  . Social connections:    Talks on phone: Not on file    Gets together: Not on file    Attends religious service: Not on file    Active member of club or organization: Not on file    Attends meetings of clubs or  organizations: Not on file    Relationship status: Not on file  Other Topics Concern  . 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    Lives with her husband.    Has 2 children in college.   G2P2   nneg ets fa    Outpatient Medications Prior to Visit  Medication Sig Dispense Refill  . Cholecalciferol (VITAMIN D PO) Take 1 tablet by mouth daily.     No facility-administered medications prior to visit.      EXAM:  BP 102/70 (BP Location: Right Arm, Patient Position: Sitting, Cuff Size: Normal)   Pulse 72   Temp 98.4 F (36.9 C) (Oral)   Wt 195 lb 12.8 oz (88.8 kg)   LMP 12/26/2009 (Approximate)   BMI 31.13 kg/m   Body mass index is 31.13  kg/m. WDWN in NAD  quiet respirations; mildly congested  somewhat hoarse. Non toxic . HEENT: Normocephalic ;atraumatic , Eyes;  PERRL, EOMs  Full, lids and conjunctiva clear,,Ears: no deformities, canals nl, TM landmarks normal, Nose: no deformity or discharge but congested;face minimally tender Mouth : OP not lesion  Tonsillar asymmetry  No change from previous exam ? Neck: Supple without adenopathy or masses or bruits right  AC   Mildly tender .    Chest:  Clear t without wheezes rales or rhonchi CV:  S1-S2 no gallops or murmurs peripheral perfusion is normal Skin :nl perfusion and no acute rashes   ASSESSMENT AND PLAN:  Discussed the following assessment and plan:  Acute respiratory infection  Tonsillar enlargement Tonsillar hypertrophy and asymmetry  Seems the same,  ent consult pending,  I interim viral resp infection  .     Low threshold  To add antibiotic if   Right sided throat pain .  -Patient advised to return or notify health care team  if symptoms worsen ,persist or new concerns arise.  Patient Instructions  I think this is  viral infection  New today   If you are getting    More right sided throat sx this week contact us  consider adding  antibiotic  .  otherwise expect to resolve on its own    Standley Brooking. Paetyn Pietrzak M.D.

## 2018-07-30 NOTE — Patient Instructions (Signed)
I think this is  viral infection  New today   If you are getting    More right sided throat sx this week contact us  consider adding antibiotic  .  otherwise expect to resolve on its own

## 2018-08-20 DIAGNOSIS — K219 Gastro-esophageal reflux disease without esophagitis: Secondary | ICD-10-CM | POA: Diagnosis not present

## 2018-08-20 DIAGNOSIS — Z7289 Other problems related to lifestyle: Secondary | ICD-10-CM | POA: Diagnosis not present

## 2018-08-20 DIAGNOSIS — J358 Other chronic diseases of tonsils and adenoids: Secondary | ICD-10-CM | POA: Diagnosis not present

## 2018-08-21 DIAGNOSIS — Z23 Encounter for immunization: Secondary | ICD-10-CM | POA: Diagnosis not present

## 2018-08-23 ENCOUNTER — Ambulatory Visit (INDEPENDENT_AMBULATORY_CARE_PROVIDER_SITE_OTHER): Payer: 59 | Admitting: Family Medicine

## 2018-08-23 DIAGNOSIS — Z23 Encounter for immunization: Secondary | ICD-10-CM

## 2018-09-04 DIAGNOSIS — J358 Other chronic diseases of tonsils and adenoids: Secondary | ICD-10-CM | POA: Diagnosis not present

## 2018-09-11 ENCOUNTER — Other Ambulatory Visit: Payer: Self-pay | Admitting: Otolaryngology

## 2018-09-11 DIAGNOSIS — J358 Other chronic diseases of tonsils and adenoids: Secondary | ICD-10-CM

## 2018-09-19 ENCOUNTER — Ambulatory Visit
Admission: RE | Admit: 2018-09-19 | Discharge: 2018-09-19 | Disposition: A | Discharge status: Home / Self Care | Payer: 59 | Source: Ambulatory Visit | Attending: Otolaryngology | Admitting: Otolaryngology

## 2018-09-19 DIAGNOSIS — J358 Other chronic diseases of tonsils and adenoids: Secondary | ICD-10-CM

## 2018-09-19 DIAGNOSIS — J351 Hypertrophy of tonsils: Secondary | ICD-10-CM | POA: Diagnosis not present

## 2018-09-19 MED ORDER — IOPAMIDOL (ISOVUE-300) INJECTION 61%
75.0000 mL | Freq: Once | INTRAVENOUS | Status: AC | PRN
Start: 2018-09-19 — End: 2018-09-19
  Administered 2018-09-19: 75 mL via INTRAVENOUS

## 2018-09-25 DIAGNOSIS — J358 Other chronic diseases of tonsils and adenoids: Secondary | ICD-10-CM | POA: Diagnosis not present

## 2018-10-23 ENCOUNTER — Ambulatory Visit
Admission: RE | Admit: 2018-10-23 | Discharge: 2018-10-23 | Disposition: A | Discharge status: Home / Self Care | Payer: 59 | Source: Ambulatory Visit | Attending: Sports Medicine | Admitting: Sports Medicine

## 2018-10-23 ENCOUNTER — Ambulatory Visit (INDEPENDENT_AMBULATORY_CARE_PROVIDER_SITE_OTHER): Payer: 59 | Admitting: Sports Medicine

## 2018-10-23 VITALS — BP 120/84 | Ht 67.0 in | Wt 190.0 lb

## 2018-10-23 DIAGNOSIS — M1711 Unilateral primary osteoarthritis, right knee: Secondary | ICD-10-CM

## 2018-10-23 DIAGNOSIS — M25561 Pain in right knee: Secondary | ICD-10-CM

## 2018-10-23 DIAGNOSIS — G8929 Other chronic pain: Secondary | ICD-10-CM | POA: Diagnosis not present

## 2018-10-23 MED ORDER — MELOXICAM 15 MG PO TABS: ORAL_TABLET | ORAL | 0 refills | Status: DC

## 2018-10-24 ENCOUNTER — Encounter: Payer: Self-pay | Admitting: Sports Medicine

## 2018-10-24 NOTE — Progress Notes (Signed)
   Subjective:    Patient ID: Anita Barnett, female    DOB: 24-Jun-1961, 57 y.o.   MRN: [id number]  HPI chief complaint: Right knee pain  Patient comes in today complaining of 6 weeks of lateral right knee pain.  She does not recall any specific injury but notice pain and swelling especially after playing tennis.  She has had persistent swelling for a few weeks.  Main complaint is stiffness and decreased motion as a result of the swelling.  She denies any locking or catching.  No feelings of instability.  She has been wearing a body helix compression sleeve.  She is also getting some discomfort in the posterior aspect of the knee.  No radiating pain.  She was seen in the office earlier this year for a ruptured Baker's cyst in the same knee.  She did make a full recovery from that.  Interim medical history reviewed Occasion reviewed Allergies reviewed    Review of Systems As above    Objective:   Physical Exam  Well-developed, well-nourished.  No acute distress.  Awake alert and oriented x3.  Vital signs reviewed.  Right knee: Range of motion is 0 to 120 degrees.  1+ effusion.  There is fullness in the popliteal fossa consistent with a probable Baker's cyst.  1-2+ patellofemoral crepitus.  No tenderness along medial lateral joint lines.  Positive Thessaly's.  Negative McMurray's.  Knee is grossly stable to ligamentous exam.  No swelling in the calf.  Neurovascularly intact distally.  Walking with a slight limp.  X-rays of the right knee including AP, lateral, and sunrise views shows moderately advanced patellofemoral DJD, especially along the lateral aspect of the patella.  There is mild medial joint space narrowing as well.      Assessment & Plan:   Right knee pain and swelling secondary to patellofemoral DJD  I discussed treatment options including cortisone injection versus oral anti-inflammatories.  We are going to try meloxicam 15 mg daily for 7 days.  She will continue with her  body helix compression sleeve.  She will need to avoid squatting and lunges.  Phone follow-up in one week for check on her progress.  If swelling persist despite oral anti-inflammatories then reconsider possible aspiration and injection.

## 2018-11-01 ENCOUNTER — Telehealth: Payer: Self-pay | Admitting: Sports Medicine

## 2018-11-01 NOTE — Telephone Encounter (Signed)
  I spoke with the patient on the phone today.  Oral anti-inflammatories are not effective.  However, she is not ready to proceed with cortisone injection.  She still complains primarily of swelling and stiffness but has minimal pain.  I recommended that she continue wearing her compression sleeve with exercise and see how things progress over the next couple of weeks.  Her x-rays do show moderate patellofemoral DJD which is the likely cause of her knee effusion and Baker's cyst.  If she reconsiders aspiration and injection then she will schedule a follow-up appointment for this procedure.  Otherwise, follow-up as needed.

## 2018-11-12 DIAGNOSIS — J358 Other chronic diseases of tonsils and adenoids: Secondary | ICD-10-CM | POA: Diagnosis not present

## 2018-11-14 ENCOUNTER — Other Ambulatory Visit: Payer: Self-pay | Admitting: Dermatology

## 2018-11-14 DIAGNOSIS — D485 Neoplasm of uncertain behavior of skin: Secondary | ICD-10-CM | POA: Diagnosis not present

## 2018-12-14 ENCOUNTER — Other Ambulatory Visit: Payer: Self-pay | Admitting: Otolaryngology

## 2018-12-14 DIAGNOSIS — J3501 Chronic tonsillitis: Secondary | ICD-10-CM | POA: Diagnosis not present

## 2018-12-14 DIAGNOSIS — J358 Other chronic diseases of tonsils and adenoids: Secondary | ICD-10-CM | POA: Diagnosis not present

## 2018-12-30 IMAGING — CT CT NECK W/ CM
1 series · 12 of 14 positions shown, 15 images · IV contrast (iopamidol)
Comparison: None.

CLINICAL DATA: Persistence asymmetric right tonsillar enlargement.
Previously treated for strep throat.

EXAM:
CT NECK WITH CONTRAST
TECHNIQUE: Multidetector CT imaging of the neck was performed using the
standard protocol following the bolus administration of intravenous
contrast.
CONTRAST:  75mL WT2VBW-CYY IOPAMIDOL (WT2VBW-CYY) INJECTION 61%

[Series 3: neck · axial · 0.38mm/px · z∈[-257,-35]mm · 12 of 133 slices shown, 15 images]
[im 11/133  soft-tissue]
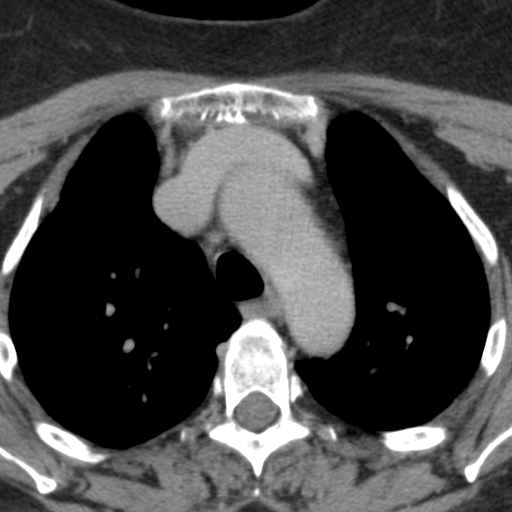
[im 11/133  bone]
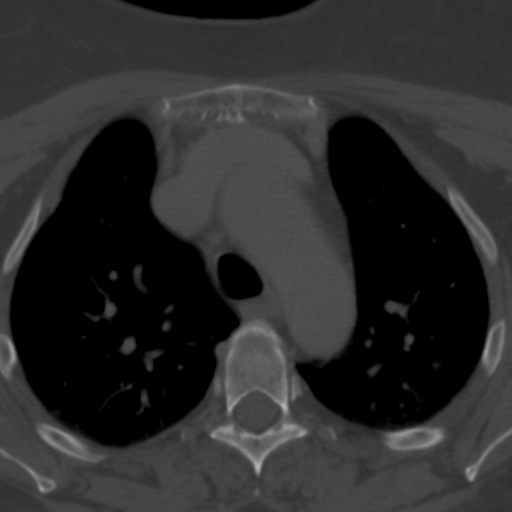
[im 21/133  bone]
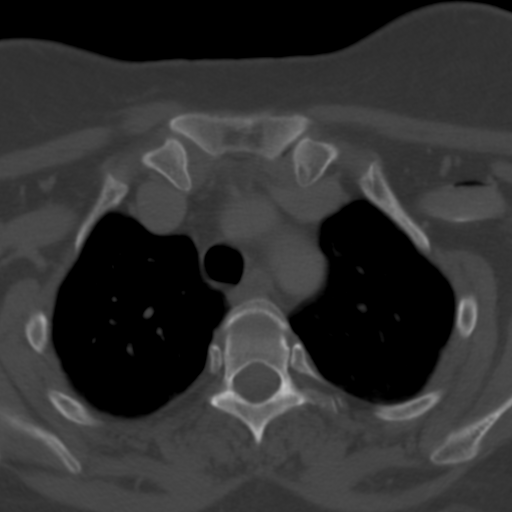
[im 31/133  bone]
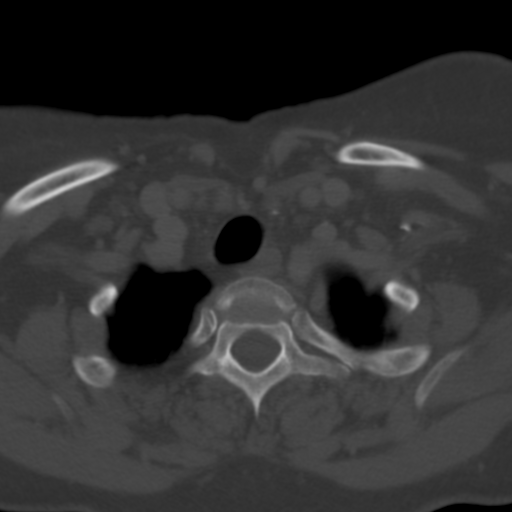
[im 41/133  bone]
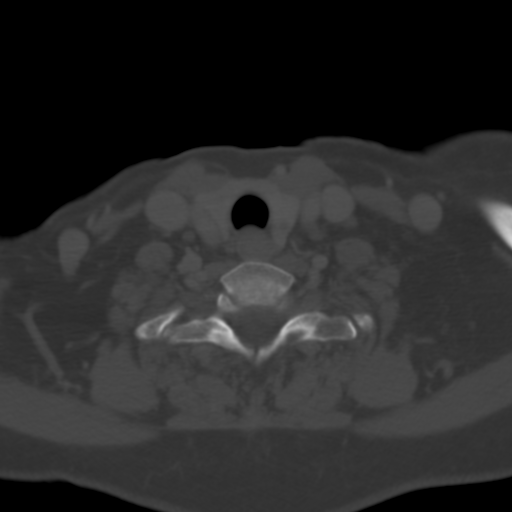
[im 51/133  soft-tissue]
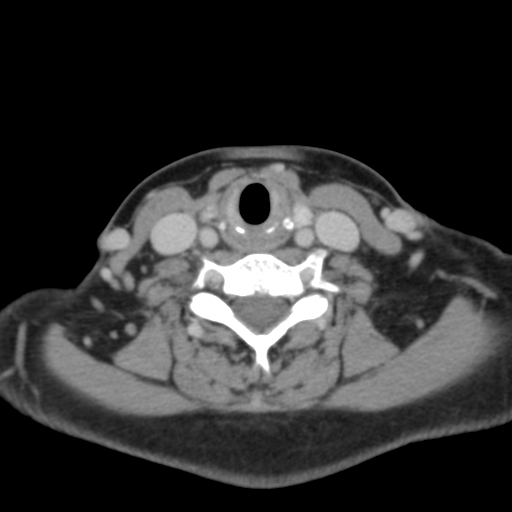
[im 51/133  bone]
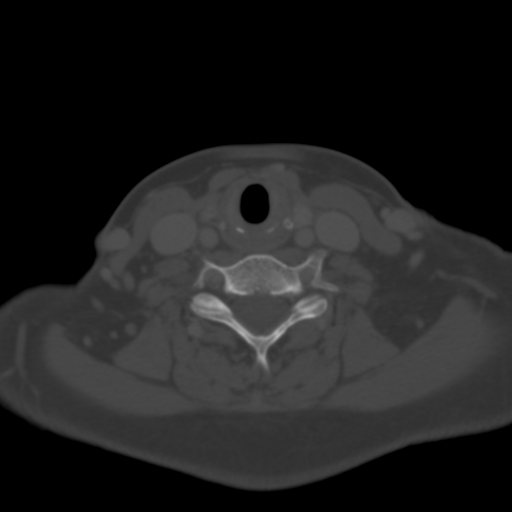
[im 61/133  bone]
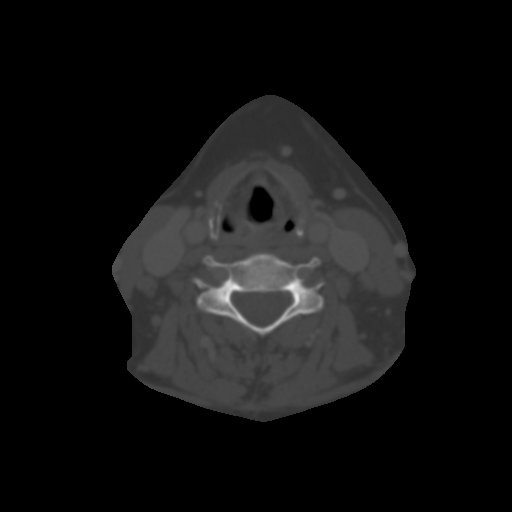
[im 72/133  bone]
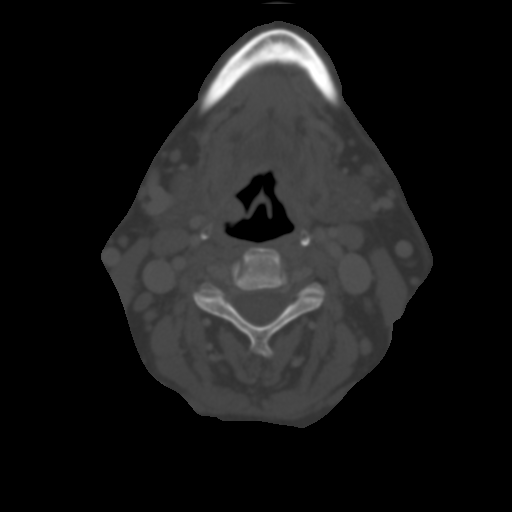
[im 82/133  bone]
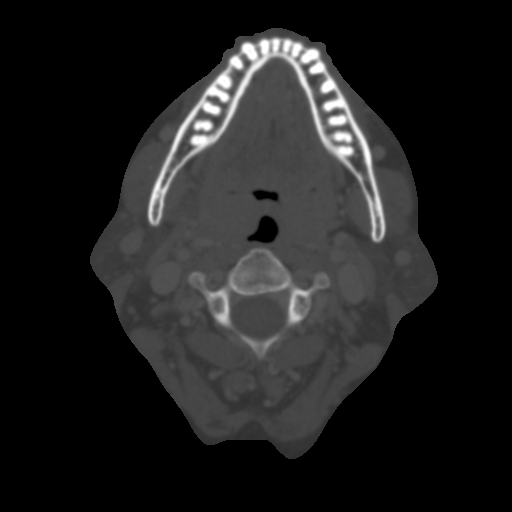
[im 92/133  soft-tissue]
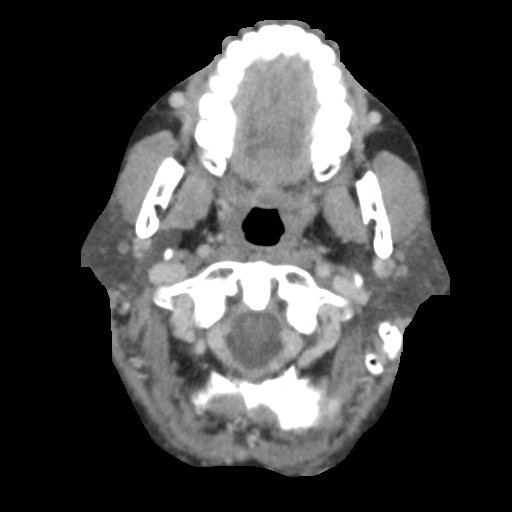
[im 92/133  bone]
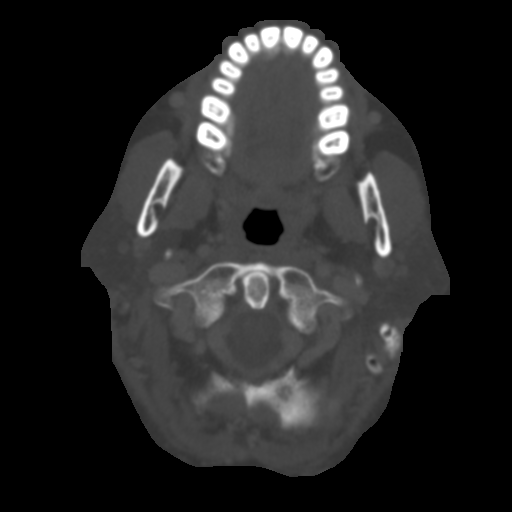
[im 102/133  bone]
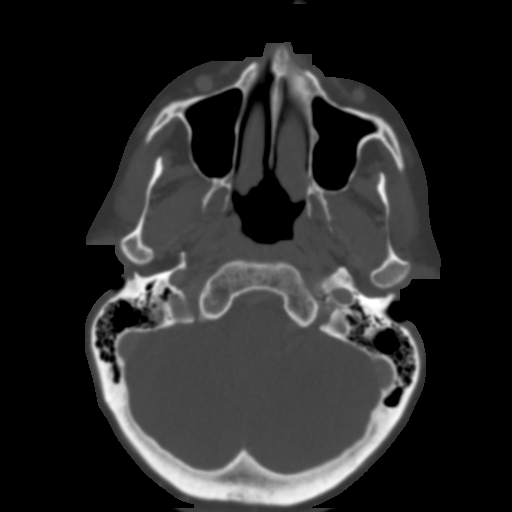
[im 112/133  bone]
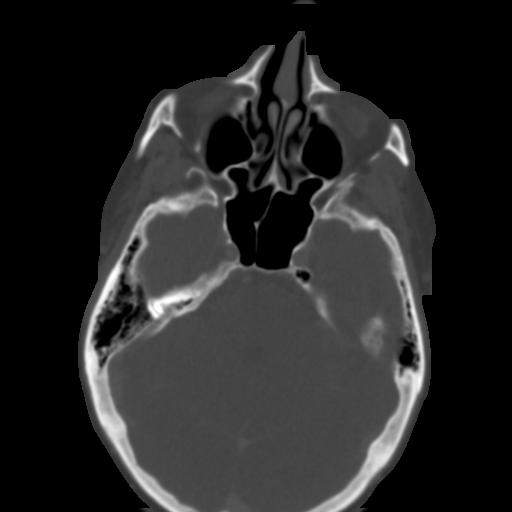
[im 122/133  bone]
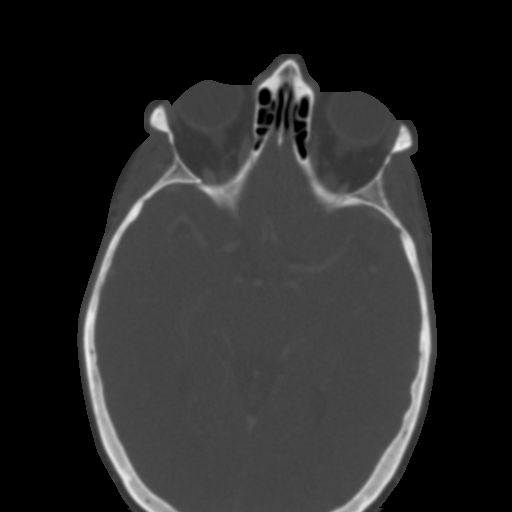

[12 of 14 positions shown; findings below may reference images not displayed]

FINDINGS: Pharynx and larynx: There is at least moderate enlargement of both
palatine tonsils with the right being slightly larger than the left.
No discrete enhancing tonsillar mass is identified. The
oropharyngeal airway is mildly narrowed. No significant inflammatory
changes or fluid collection is seen in the parapharyngeal or
retropharyngeal spaces. The larynx is unremarkable.

Salivary glands: No inflammation, mass, or stone.

Thyroid: Unremarkable.

Lymph nodes: Borderline enlarged level IIa lymph nodes measure up to
10 mm in short axis bilaterally. There is a mildly prominent number
of subcentimeter lymph nodes elsewhere in the anterior and posterior
cervical chains bilaterally, some with mildly rounded configuration.

Vascular: Major vascular structures of the neck are patent.

Limited intracranial: Unremarkable.

Visualized orbits: Unremarkable.

Mastoids and visualized paranasal sinuses: Clear.

Skeleton: No acute osseous abnormality or suspicious osseous lesion.

Upper chest: Clear lung apices.

Other: None.
IMPRESSION: 1. Bilateral tonsillar enlargement without a discrete mass.
2. Mild bilateral cervical lymphadenopathy, nonspecific.

## 2019-01-21 DIAGNOSIS — Z1231 Encounter for screening mammogram for malignant neoplasm of breast: Secondary | ICD-10-CM | POA: Diagnosis not present

## 2019-01-21 LAB — HM MAMMOGRAPHY

## 2019-02-02 IMAGING — DX DG KNEE AP/LAT W/ SUNRISE*R*
3 series · 3 of 3 positions shown · non-contrast
Comparison: None

CLINICAL DATA: Pain and swelling of right lateral knee for 6 weeks.

EXAM:
RIGHT KNEE 3 VIEWS

[dg knee ap/lat w/ sunrise right (1 of 3)]
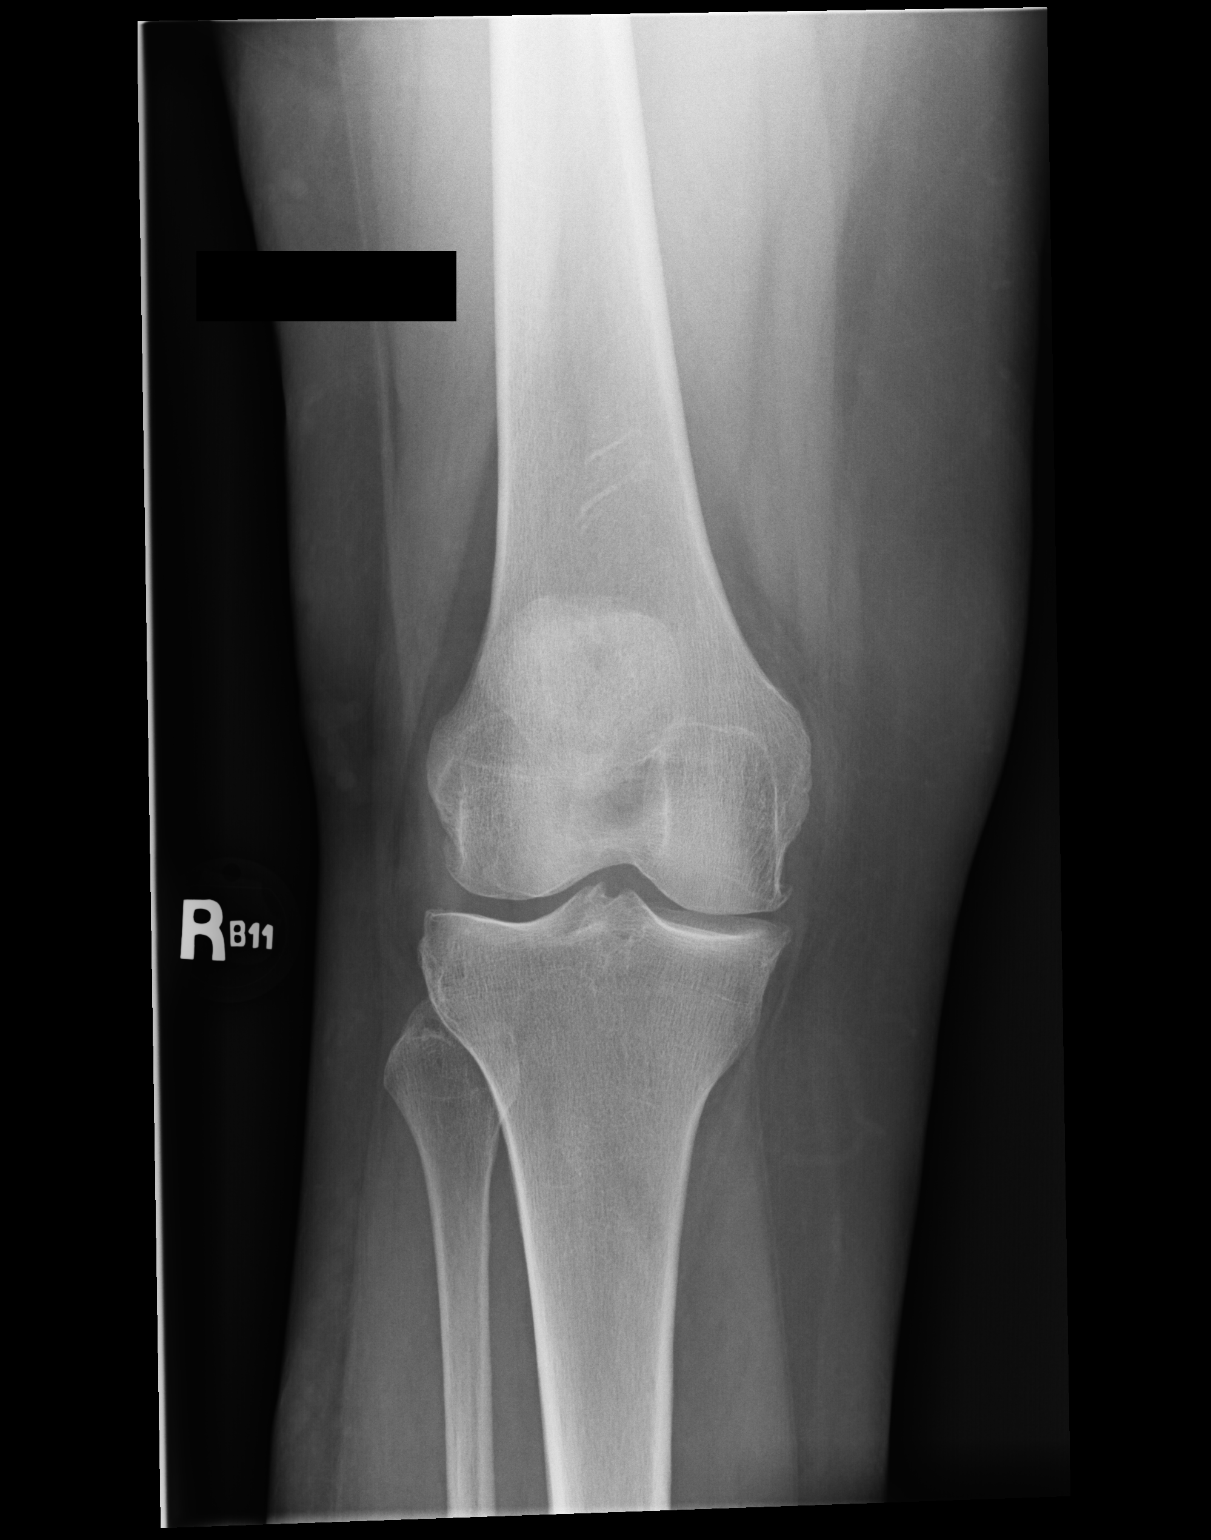

[dg knee ap/lat w/ sunrise right (2 of 3)]
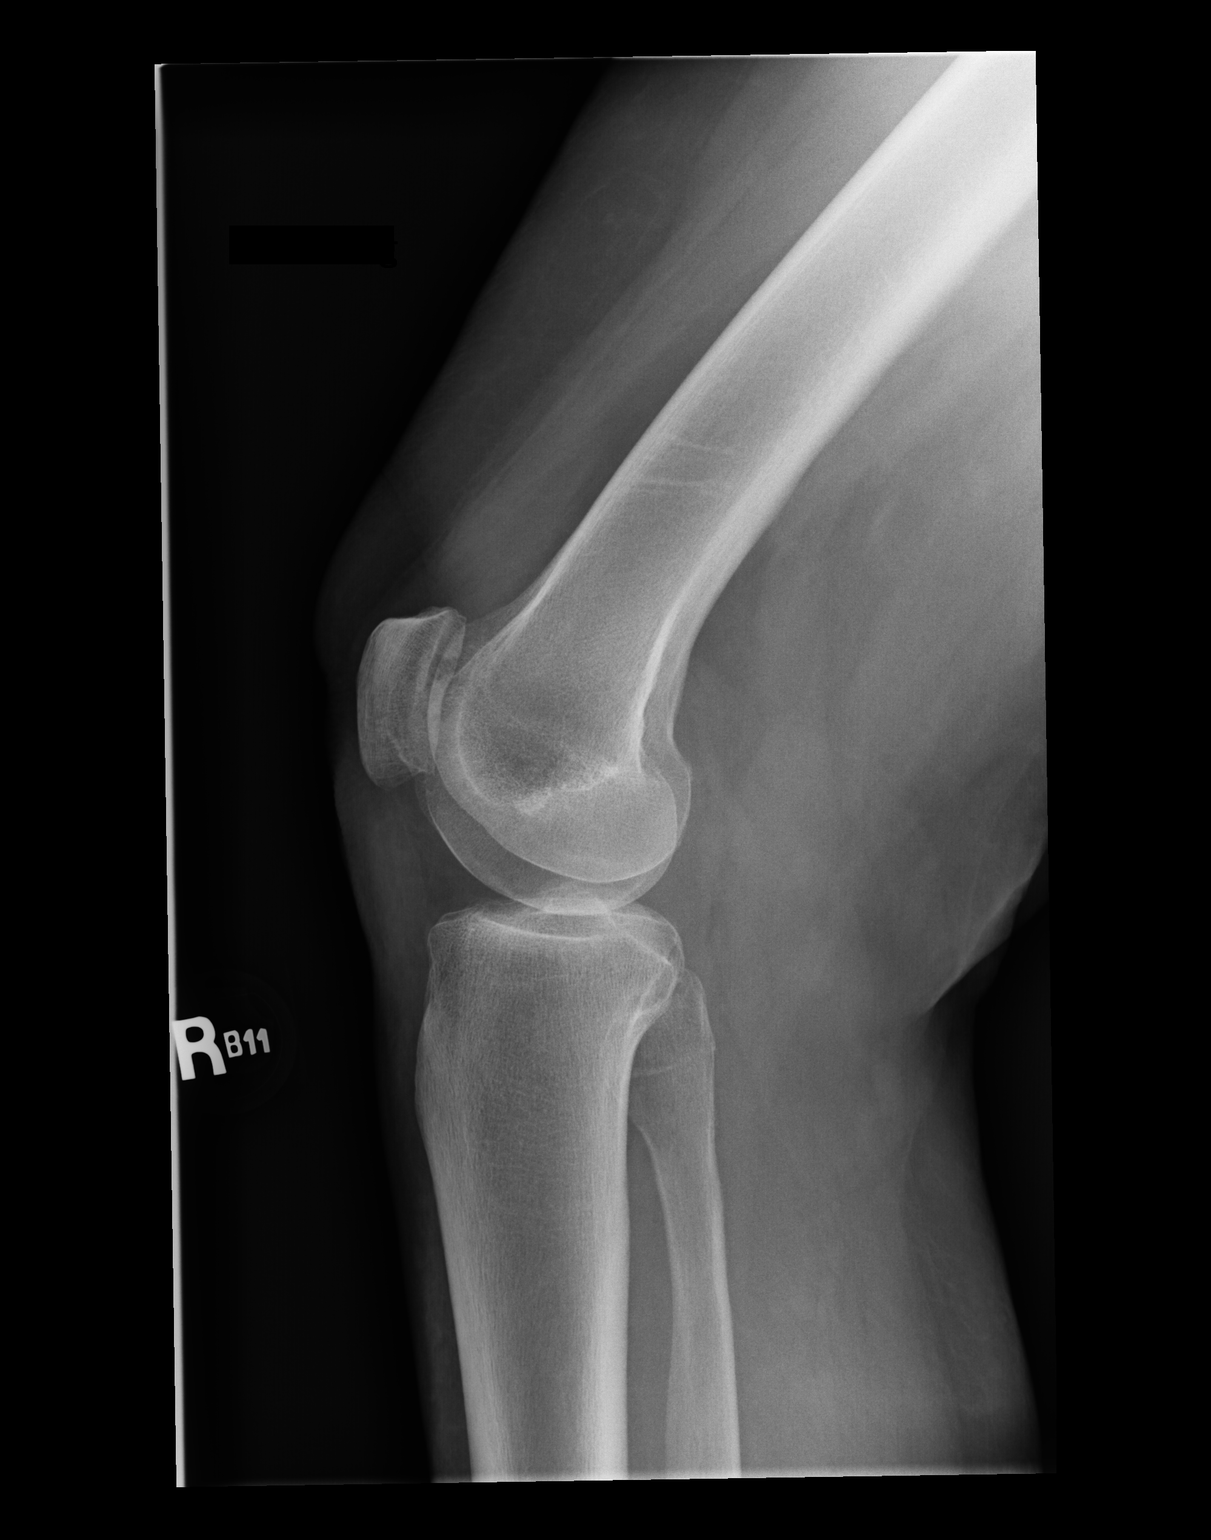

[dg knee ap/lat w/ sunrise right (3 of 3)]
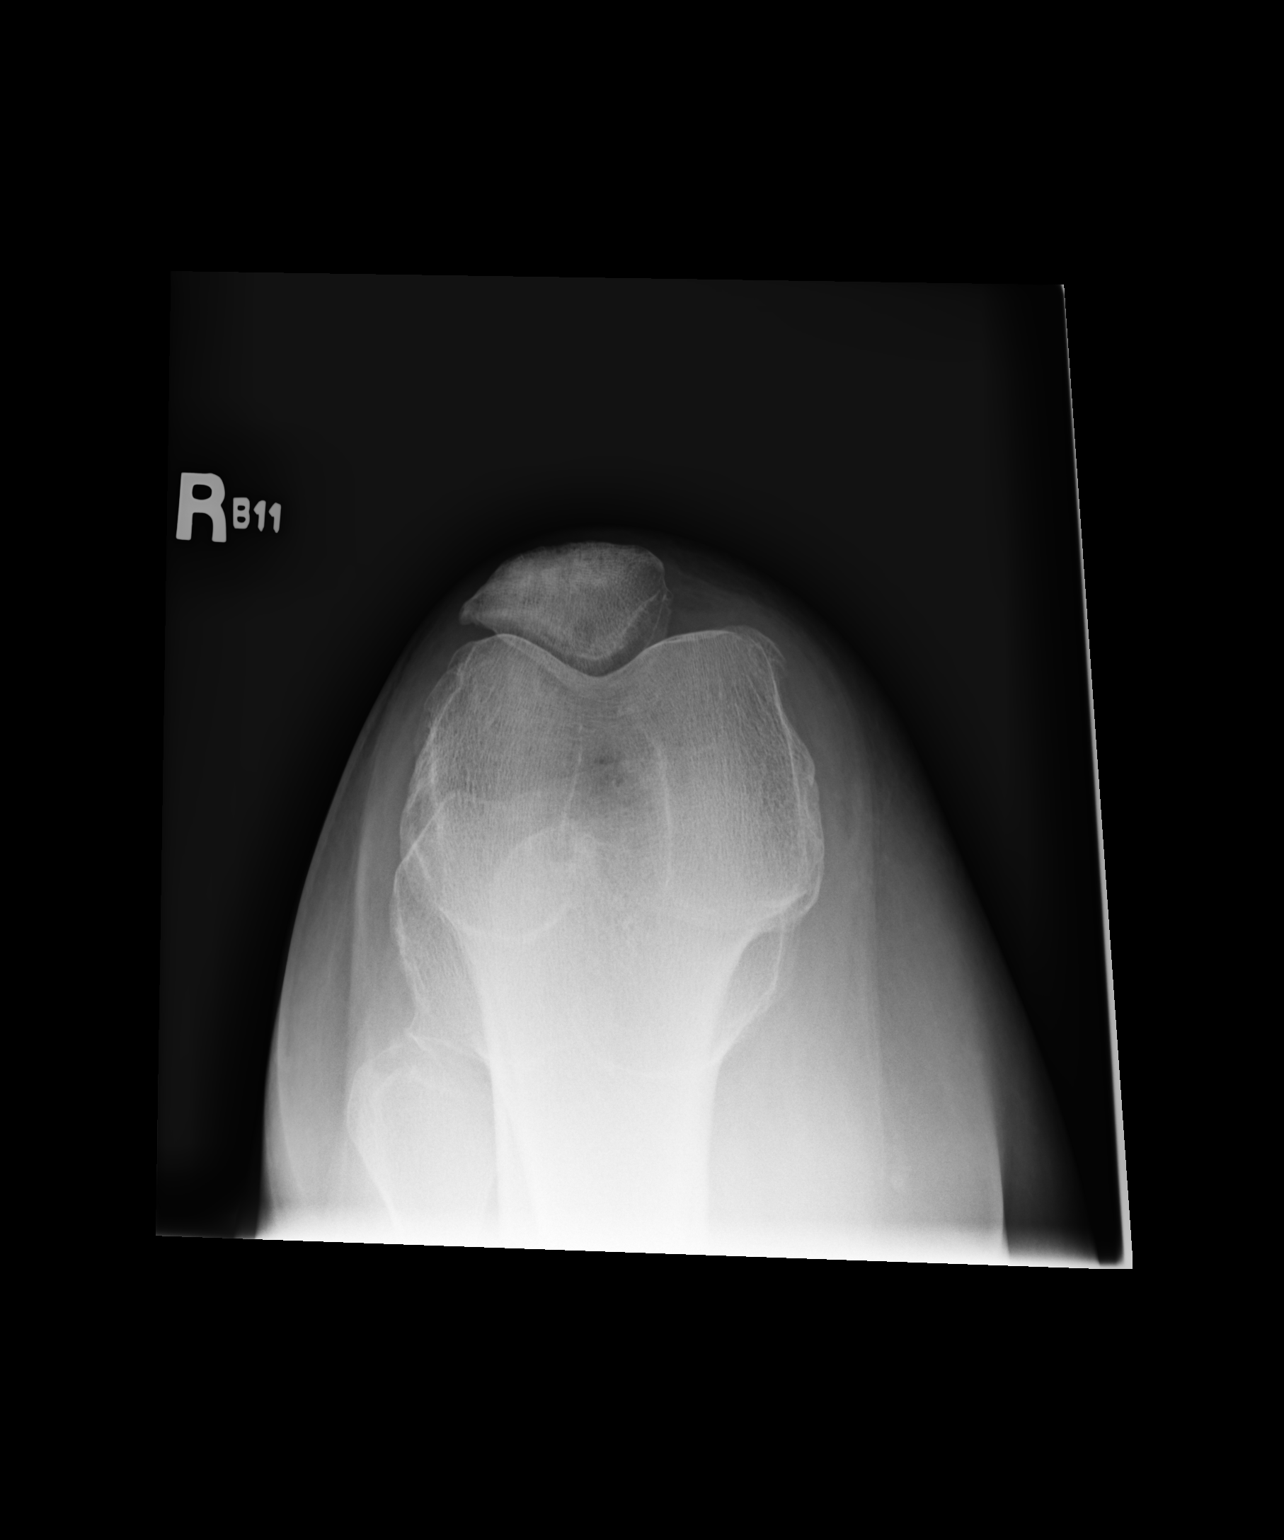

[3 of 3 positions shown; findings below may reference images not displayed]

FINDINGS: No evidence of fracture, dislocation, or joint effusion. Mild medial
joint space narrowing with associated proliferative disease. Mild to
moderate patellofemoral disease more significantly affecting the
lateral facet. Soft tissues are unremarkable.
IMPRESSION: Osteoarthritis with mild medial joint space narrowing and mild to
moderate patellofemoral disease primarily affecting the lateral
patellofemoral joint.

## 2019-02-05 DIAGNOSIS — M25561 Pain in right knee: Secondary | ICD-10-CM | POA: Diagnosis not present

## 2019-02-05 DIAGNOSIS — M1711 Unilateral primary osteoarthritis, right knee: Secondary | ICD-10-CM | POA: Diagnosis not present

## 2019-02-11 ENCOUNTER — Encounter: Payer: Self-pay | Admitting: Internal Medicine

## 2019-02-14 DIAGNOSIS — M25561 Pain in right knee: Secondary | ICD-10-CM | POA: Diagnosis not present

## 2019-02-28 DIAGNOSIS — M25561 Pain in right knee: Secondary | ICD-10-CM | POA: Diagnosis not present

## 2019-02-28 DIAGNOSIS — M1711 Unilateral primary osteoarthritis, right knee: Secondary | ICD-10-CM | POA: Diagnosis not present

## 2019-11-13 ENCOUNTER — Other Ambulatory Visit: Payer: Self-pay | Admitting: Dermatology

## 2019-11-26 NOTE — Progress Notes (Signed)
Chief Complaint  Patient presents with   Annual Exam    Pt has no concerns    HPI: Patient  Anita Barnett  58 y.o. comes in today for [place] visit    Gyne check soon hsa tickel in throat ever since  Tonsillectomy  Last year   Not sure if improtant  Has lost weight  Over year and feels quite well exercising Had  Some hair shedding  Now better   After weight loss Taking vit d  ? 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   ROS:  GEN/ HEENT: No fever, significant weight changes sweats headaches vision problems hearing changes, CV/ PULM; No chest pain shortness of breath cough, syncope,edema  change in exercise tolerance. GI /GU: No adominal pain, vomiting, change in bowel habits. No blood in the stool. No significant GU symptoms. SKIN/HEME: ,no acute skin rashes suspicious lesions or bleeding. No lymphadenopathy, nodules, masses.  NEURO/ PSYCH:  No neurologic signs such as weakness numbness. No depression anxiety. IMM/ Allergy: No unusual infections.  Allergy .   REST of 12 system review negative except as per HPI   Past Medical History:  Diagnosis Date   Colon polyps    History of shingles    Hx of varicella    Idiopathic parathyroidism ([place])    Seasonal allergies     Past Surgical History:  Procedure Laterality Date   PARATHYROIDECTOMY N/A 11/23/2015   Procedure: LEFT INFERIOR PARATHYROIDECTOMY;  Surgeon: Armandina Gemma, MD;  Location: [facility];  Service: General;   Laterality: N/A;   TOE SURGERY Left 1996   pin in little toe   TONSILLECTOMY  2019    Family History  Problem Relation Age of Onset   COPD Mother        smoker   Heart disease Father        CABG x 3   Diabetes Maternal Grandfather        type 1    Social History   Socioeconomic History   Marital status: Married    Spouse name: Not on file   Number of children: 2   Years of education: Not on file   Highest education level: Not on file  Occupational History   Occupation: cpa  Social Needs   Financial resource strain: Not on file   Food insecurity    Worry: Not on file    Inability: Not on file   Transportation needs    Medical: Not on file    Non-medical: Not on file  Tobacco Use   Smoking status: Never Smoker   Smokeless tobacco: Never Used  Substance and Sexual Activity   Alcohol use: Yes    Alcohol/week: 2.0 - 3.0 standard drinks    Types: 2 - 3 Cans of beer per week    Comment: occ   Drug use: No   Sexual  activity: Yes    Partners: Male    Birth control/protection: Post-menopausal  Lifestyle   Physical activity    Days per week: Not on file    Minutes per session: Not on file   Stress: Not on file  Relationships   Social connections    Talks on phone: Not on file    Gets together: Not on file    Attends religious service: Not on file    Active member of club or organization: Not on file    Attends meetings of clubs or organizations: Not on file    Relationship status: Not on file  Other Topics Concern   Not on file  Social History Narrative   7 hours of sleep per night   Works part time as a Engineer, 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    Lives with her husband.    Has 2 children in college.   G2P2   nneg ets fa    Outpatient Medications Prior to Visit  Medication Sig Dispense Refill   Cholecalciferol (VITAMIN D PO) Take 1 tablet by mouth daily.     meloxicam (MOBIC) 15 MG tablet Take 1 tablet daily with food for 7 days. Then  take as needed. (Patient not taking: Reported on 11/27/2019) 40 tablet 0   No facility-administered medications prior to visit.      EXAM:  BP 120/64 (BP Location: Right Arm, Patient Position: Sitting, Cuff Size: Normal)    Pulse 67    Temp 97.6 F (36.4 C) (Temporal)    Ht 5\' 7"  (1.702 m)    Wt 156 lb 6.4 oz (70.9 kg)    LMP 12/26/2009 (Approximate)    SpO2 97%    BMI 24.50 kg/m   Body mass index is 24.5 kg/m. Wt Readings from Last 3 Encounters:  11/27/19 156 lb 6.4 oz (70.9 kg)  10/23/18 190 lb (86.2 kg)  07/30/18 195 lb 12.8 oz (88.8 kg)    Physical Exam: Vital signs reviewed [id number] is a well-developed well-nourished alert cooperative    who appearsr stated age in no acute distress.  HEENT: normocephalic atraumatic , Eyes: PERRL EOM's full, conjunctiva clear, Nares: paten,t no deformity discharge or tenderness., Ears: no deformity EAC's clear TMs with normal landmarks. Mouth: masked r. NECK: supple without masses, thyromegaly or bruits. CHEST/PULM:  Clear to auscultation and percussion breath sounds equal no wheeze , rales or rhonchi. No chest wall deformities or tenderness. Breast:deferred   To gyne CV: PMI is nondisplaced, S1 S2 no gallops, murmurs, rubs. Peripheral pulses are full without delay.No JVD .  ABDOMEN: Bowel sounds normal nontender  No guard or rebound, no hepato splenomegal no CVA tenderness.  No hernia. Extremtities:  No clubbing cyanosis or edema, no acute joint swelling or redness no focal atrophy NEURO:  Oriented x3, cranial nerves 3-12 appear to be intact, no obvious focal weakness,gait within normal limits no abnormal reflexes or asymmetrical SKIN: No acute rashes normal turgor, color, no bruising or petechiae. PSYCH: Oriented, good eye contact, no obvious depression anxiety, cognition and judgment appear normal. LN: no cervical axillary inguinal adenopathy  Lab Results  Component Value Date   WBC 6.6 07/19/2018   HGB 12.8 07/19/2018   HCT 38.0  07/19/2018   PLT 333.0 07/19/2018   GLUCOSE 92 07/19/2018   CHOL 154 07/19/2018   TRIG 60.0 07/19/2018   HDL 50.20 07/19/2018   LDLCALC 92 07/19/2018   ALT 22 07/19/2018   AST 19 07/19/2018   NA  141 07/19/2018   K 4.4 07/19/2018   CL 106 07/19/2018   CREATININE 0.80 07/19/2018   BUN 18 07/19/2018   CO2 27 07/19/2018   TSH 3.97 07/19/2018    BP Readings from Last 3 Encounters:  11/27/19 120/64  10/23/18 120/84  07/30/18 102/70    Lab plan reviewed with patient  Pat fasting fro afternoon labs   ASSESSMENT AND PLAN:  Discussed the following assessment and plan:    ICD-10-CM   1. Visit for preventive health examination  123456 Basic metabolic panel    CBC with Differential    Hepatic function panel    Lipid panel    TSH    T4, Free (Thyrox)  2. Vitamin D deficiency  E55.9 Vitamin D 25 hydroxy    Patient Care Team: Hermilo Dutter, Standley Brooking, MD as PCP - General (Internal Medicine) Kem Boroughs, FNP as Nurse Practitioner (Nurse Practitioner) Juanita Craver, MD as Consulting Physician (Gastroenterology) Lavonna Monarch, MD as Consulting Physician (Dermatology) Patient Instructions   Glad you are doing well Can check with ent if needed but  Exam is good today .    Health Maintenance, Female Adopting a healthy lifestyle and getting preventive care are important in promoting health and wellness. Ask your health care provider about:  The right schedule for you to have regular tests and exams.  Things you can do on your own to prevent diseases and keep yourself healthy. What should I know about diet, weight, and exercise? Eat a healthy diet   Eat a diet that includes plenty of vegetables, fruits, low-fat dairy products, and lean protein.  Do not eat a lot of foods that are high in solid fats, added sugars, or sodium. Maintain a healthy weight Body mass index (BMI) is used to identify weight problems. It estimates body fat based on height and weight. Your health care  provider can help determine your BMI and help you achieve or maintain a healthy weight. Get regular exercise Get regular exercise. This is one of the most important things you can do for your health. Most adults should:  Exercise for at least 150 minutes each week. The exercise should increase your heart rate and make you sweat (moderate-intensity exercise).  Do strengthening exercises at least twice a week. This is in addition to the moderate-intensity exercise.  Spend less time sitting. Even light physical activity can be beneficial. Watch cholesterol and blood lipids Have your blood tested for lipids and cholesterol at 58 years of age, then have this test every 5 years. Have your cholesterol levels checked more often if:  Your lipid or cholesterol levels are high.  You are older than 58 years of age.  You are at high risk for heart disease. What should I know about cancer screening? Depending on your health history and family history, you may need to have cancer screening at various ages. This may include screening for:  Breast cancer.  Cervical cancer.  Colorectal cancer.  Skin cancer.  Lung cancer. What should I know about heart disease, diabetes, and high blood pressure? Blood pressure and heart disease  High blood pressure causes heart disease and increases the risk of stroke. This is more likely to develop in people who have high blood pressure readings, are of African descent, or are overweight.  Have your blood pressure checked: ? Every 3-5 years if you are 42-100 years of age. ? Every year if you are 91 years old or older. Diabetes Have regular diabetes screenings. This checks your  fasting blood sugar level. Have the screening done:  Once every three years after age 62 if you are at a normal weight and have a low risk for diabetes.  More often and at a younger age if you are overweight or have a high risk for diabetes. What should I know about preventing  infection? Hepatitis B If you have a higher risk for hepatitis B, you should be screened for this virus. Talk with your health care provider to find out if you are at risk for hepatitis B infection. Hepatitis C Testing is recommended for:  Everyone born from [birth year] through 1965.  Anyone with known risk factors for hepatitis C. Sexually transmitted infections (STIs)  Get screened for STIs, including gonorrhea and chlamydia, if: ? You are sexually active and are younger than 58 years of age. ? You are older than 58 years of age and your health care provider tells you that you are at risk for this type of infection. ? Your sexual activity has changed since you were last screened, and you are at increased risk for chlamydia or gonorrhea. Ask your health care provider if you are at risk.  Ask your health care provider about whether you are at high risk for HIV. Your health care provider may recommend a prescription medicine to help prevent HIV infection. If you choose to take medicine to prevent HIV, you should first get tested for HIV. You should then be tested every 3 months for as long as you are taking the medicine. Pregnancy  If you are about to stop having your period (premenopausal) and you may become pregnant, seek counseling before you get pregnant.  Take 400 to 800 micrograms (mcg) of folic acid every day if you become pregnant.  Ask for birth control (contraception) if you want to prevent pregnancy. Osteoporosis and menopause Osteoporosis is a disease in which the bones lose minerals and strength with aging. This can result in bone fractures. If you are 62 years old or older, or if you are at risk for osteoporosis and fractures, ask your health care provider if you should:  Be screened for bone loss.  Take a calcium or vitamin D supplement to lower your risk of fractures.  Be given hormone replacement therapy (HRT) to treat symptoms of menopause. Follow these instructions at  home: Lifestyle  Do not use any products that contain nicotine or tobacco, such as cigarettes, e-cigarettes, and chewing tobacco. If you need help quitting, ask your health care provider.  Do not use street drugs.  Do not share needles.  Ask your health care provider for help if you need support or information about quitting drugs. Alcohol use  Do not drink alcohol if: ? Your health care provider tells you not to drink. ? You are pregnant, may be pregnant, or are planning to become pregnant.  If you drink alcohol: ? Limit how much you use to 0-1 drink a day. ? Limit intake if you are breastfeeding.  Be aware of how much alcohol is in your drink. In the [place] one drink equals one 12 oz bottle of beer (355 mL), one 5 oz glass of wine (148 mL), or one 1 oz glass of hard liquor (44 mL). General instructions  Schedule regular health, dental, and eye exams.  Stay current with your vaccines.  Tell your health care provider if: ? You often feel depressed. ? You have ever been abused or do not feel safe at home. Summary  Adopting a healthy lifestyle  and getting preventive care are important in promoting health and wellness.  Follow your health care provider's instructions about healthy diet, exercising, and getting tested or screened for diseases.  Follow your health care provider's instructions on monitoring your cholesterol and blood pressure. This information is not intended to replace advice given to you by your health care provider. Make sure you discuss any questions you have with your health care provider. Document Released: 06/27/2011 Document Revised: 12/05/2018 Document Reviewed: 12/05/2018 Elsevier Patient Education  2020 [employer] [place] M.D.

## 2019-11-27 ENCOUNTER — Encounter: Payer: Self-pay | Admitting: Internal Medicine

## 2019-11-27 ENCOUNTER — Other Ambulatory Visit: Payer: Self-pay

## 2019-11-27 ENCOUNTER — Ambulatory Visit (INDEPENDENT_AMBULATORY_CARE_PROVIDER_SITE_OTHER): Payer: 59 | Admitting: Internal Medicine

## 2019-11-27 VITALS — BP 120/64 | HR 67 | Temp 97.6°F | Ht 67.0 in | Wt 156.4 lb

## 2019-11-27 DIAGNOSIS — Z0000 Encounter for general adult medical examination without abnormal findings: Secondary | ICD-10-CM | POA: Diagnosis not present

## 2019-11-27 DIAGNOSIS — E559 Vitamin D deficiency, unspecified: Secondary | ICD-10-CM | POA: Diagnosis not present

## 2019-11-27 NOTE — Patient Instructions (Signed)
Glad you are doing well Can check with ent if needed but  Exam is good today .    Health Maintenance, Female Adopting a healthy lifestyle and getting preventive care are important in promoting health and wellness. Ask your health care provider about:  The right schedule for you to have regular tests and exams.  Things you can do on your own to prevent diseases and keep yourself healthy. What should I know about diet, weight, and exercise? Eat a healthy diet   Eat a diet that includes plenty of vegetables, fruits, low-fat dairy products, and lean protein.  Do not eat a lot of foods that are high in solid fats, added sugars, or sodium. Maintain a healthy weight Body mass index (BMI) is used to identify weight problems. It estimates body fat based on height and weight. Your health care provider can help determine your BMI and help you achieve or maintain a healthy weight. Get regular exercise Get regular exercise. This is one of the most important things you can do for your health. Most adults should:  Exercise for at least 150 minutes each week. The exercise should increase your heart rate and make you sweat (moderate-intensity exercise).  Do strengthening exercises at least twice a week. This is in addition to the moderate-intensity exercise.  Spend less time sitting. Even light physical activity can be beneficial. Watch cholesterol and blood lipids Have your blood tested for lipids and cholesterol at 58 years of age, then have this test every 5 years. Have your cholesterol levels checked more often if:  Your lipid or cholesterol levels are high.  You are older than 58 years of age.  You are at high risk for heart disease. What should I know about cancer screening? Depending on your health history and family history, you may need to have cancer screening at various ages. This may include screening for:  Breast cancer.  Cervical cancer.  Colorectal cancer.  Skin cancer.   Lung cancer. What should I know about heart disease, diabetes, and high blood pressure? Blood pressure and heart disease  High blood pressure causes heart disease and increases the risk of stroke. This is more likely to develop in people who have high blood pressure readings, are of African descent, or are overweight.  Have your blood pressure checked: ? Every 3-5 years if you are 48-65 years of age. ? Every year if you are 76 years old or older. Diabetes Have regular diabetes screenings. This checks your fasting blood sugar level. Have the screening done:  Once every three years after age 65 if you are at a normal weight and have a low risk for diabetes.  More often and at a younger age if you are overweight or have a high risk for diabetes. What should I know about preventing infection? Hepatitis B If you have a higher risk for hepatitis B, you should be screened for this virus. Talk with your health care provider to find out if you are at risk for hepatitis B infection. Hepatitis C Testing is recommended for:  Everyone born from [birth year] through 1965.  Anyone with known risk factors for hepatitis C. Sexually transmitted infections (STIs)  Get screened for STIs, including gonorrhea and chlamydia, if: ? You are sexually active and are younger than 58 years of age. ? You are older than 58 years of age and your health care provider tells you that you are at risk for this type of infection. ? Your sexual activity has changed  since you were last screened, and you are at increased risk for chlamydia or gonorrhea. Ask your health care provider if you are at risk.  Ask your health care provider about whether you are at high risk for HIV. Your health care provider may recommend a prescription medicine to help prevent HIV infection. If you choose to take medicine to prevent HIV, you should first get tested for HIV. You should then be tested every 3 months for as long as you are taking the  medicine. Pregnancy  If you are about to stop having your period (premenopausal) and you may become pregnant, seek counseling before you get pregnant.  Take 400 to 800 micrograms (mcg) of folic acid every day if you become pregnant.  Ask for birth control (contraception) if you want to prevent pregnancy. Osteoporosis and menopause Osteoporosis is a disease in which the bones lose minerals and strength with aging. This can result in bone fractures. If you are 64 years old or older, or if you are at risk for osteoporosis and fractures, ask your health care provider if you should:  Be screened for bone loss.  Take a calcium or vitamin D supplement to lower your risk of fractures.  Be given hormone replacement therapy (HRT) to treat symptoms of menopause. Follow these instructions at home: Lifestyle  Do not use any products that contain nicotine or tobacco, such as cigarettes, e-cigarettes, and chewing tobacco. If you need help quitting, ask your health care provider.  Do not use street drugs.  Do not share needles.  Ask your health care provider for help if you need support or information about quitting drugs. Alcohol use  Do not drink alcohol if: ? Your health care provider tells you not to drink. ? You are pregnant, may be pregnant, or are planning to become pregnant.  If you drink alcohol: ? Limit how much you use to 0-1 drink a day. ? Limit intake if you are breastfeeding.  Be aware of how much alcohol is in your drink. In the [place] one drink equals one 12 oz bottle of beer (355 mL), one 5 oz glass of wine (148 mL), or one 1 oz glass of hard liquor (44 mL). General instructions  Schedule regular health, dental, and eye exams.  Stay current with your vaccines.  Tell your health care provider if: ? You often feel depressed. ? You have ever been abused or do not feel safe at home. Summary  Adopting a healthy lifestyle and getting preventive care are important in  promoting health and wellness.  Follow your health care provider's instructions about healthy diet, exercising, and getting tested or screened for diseases.  Follow your health care provider's instructions on monitoring your cholesterol and blood pressure. This information is not intended to replace advice given to you by your health care provider. Make sure you discuss any questions you have with your health care provider. Document Released: 06/27/2011 Document Revised: 12/05/2018 Document Reviewed: 12/05/2018 Elsevier Patient Education  2020 [employer].

## 2019-11-28 LAB — BASIC METABOLIC PANEL
BUN: 22 mg/dL (ref 6–23)
CO2: 29 mEq/L (ref 19–32)
Calcium: 10 mg/dL (ref 8.4–10.5)
Chloride: 102 mEq/L (ref 96–112)
Creatinine, Ser: 0.78 mg/dL (ref 0.40–1.20)
GFR: 75.85 mL/min (ref >60.00)
Glucose, Bld: 86 mg/dL (ref 70–99)
Potassium: 4.4 mEq/L (ref 3.5–5.1)
Sodium: 139 mEq/L (ref 135–145)

## 2019-11-28 LAB — CBC WITH DIFFERENTIAL/PLATELET
Basophils Absolute: 0.1 10*3/uL (ref 0.0–0.1)
Basophils Relative: 0.8 % (ref 0.0–3.0)
Eosinophils Absolute: 0.1 10*3/uL (ref 0.0–0.7)
Eosinophils Relative: 1.1 % (ref 0.0–5.0)
HCT: 40.4 % (ref 36.0–46.0)
Hemoglobin: 13.4 g/dL (ref 12.0–15.0)
Lymphocytes Relative: 29.5 % (ref 12.0–46.0)
Lymphs Abs: 2.3 10*3/uL (ref 0.7–4.0)
MCHC: 33.3 g/dL (ref 30.0–36.0)
MCV: 93.6 fl (ref 78.0–100.0)
Monocytes Absolute: 0.5 10*3/uL (ref 0.1–1.0)
Monocytes Relative: 6.8 % (ref 3.0–12.0)
Neutro Abs: 4.8 10*3/uL (ref 1.4–7.7)
Neutrophils Relative %: 61.8 % (ref 43.0–77.0)
Platelets: 310 10*3/uL (ref 150.0–400.0)
RBC: 4.31 Mil/uL (ref 3.87–5.11)
RDW: 13.6 % (ref 11.5–15.5)
WBC: 7.7 10*3/uL (ref 4.0–10.5)

## 2019-11-28 LAB — LIPID PANEL
Cholesterol: 147 mg/dL (ref 0–200)
HDL: 54.7 mg/dL (ref >39.00)
LDL Cholesterol: 83 mg/dL (ref 0–99)
NonHDL: 91.88
Total CHOL/HDL Ratio: 3
Triglycerides: 42 mg/dL (ref 0.0–149.0)
VLDL: 8.4 mg/dL (ref 0.0–40.0)

## 2019-11-28 LAB — TSH: TSH: 1.56 u[IU]/mL (ref 0.35–4.50)

## 2019-11-28 LAB — HEPATIC FUNCTION PANEL
ALT: 44 U/L — ABNORMAL HIGH (ref 0–35)
AST: 29 U/L (ref 0–37)
Albumin: 4.6 g/dL (ref 3.5–5.2)
Alkaline Phosphatase: 93 U/L (ref 39–117)
Bilirubin, Direct: 0.1 mg/dL (ref 0.0–0.3)
Total Bilirubin: 0.6 mg/dL (ref 0.2–1.2)
Total Protein: 7.2 g/dL (ref 6.0–8.3)

## 2019-11-28 LAB — T4, FREE: Free T4: 0.97 ng/dL (ref 0.60–1.60)

## 2019-11-28 LAB — VITAMIN D 25 HYDROXY (VIT D DEFICIENCY, FRACTURES): VITD: 46.65 ng/mL (ref 30.00–100.00)

## 2019-12-02 ENCOUNTER — Other Ambulatory Visit: Payer: Self-pay

## 2019-12-02 DIAGNOSIS — E785 Hyperlipidemia, unspecified: Secondary | ICD-10-CM

## 2020-01-27 ENCOUNTER — Encounter: Payer: Self-pay | Admitting: Obstetrics and Gynecology

## 2020-01-27 LAB — HM MAMMOGRAPHY

## 2020-02-06 ENCOUNTER — Encounter: Payer: Self-pay | Admitting: Internal Medicine

## 2020-03-25 ENCOUNTER — Ambulatory Visit (INDEPENDENT_AMBULATORY_CARE_PROVIDER_SITE_OTHER): Payer: 59 | Admitting: Dermatology

## 2020-03-25 ENCOUNTER — Other Ambulatory Visit: Payer: Self-pay

## 2020-03-25 DIAGNOSIS — D485 Neoplasm of uncertain behavior of skin: Secondary | ICD-10-CM | POA: Diagnosis not present

## 2020-03-25 DIAGNOSIS — D492 Neoplasm of unspecified behavior of bone, soft tissue, and skin: Secondary | ICD-10-CM

## 2020-03-25 NOTE — Patient Instructions (Signed)

## 2020-03-29 ENCOUNTER — Encounter: Payer: Self-pay | Admitting: Dermatology

## 2020-03-29 NOTE — Progress Notes (Signed)
   Follow-Up Visit   Subjective  Anita Barnett is a 59 y.o. female who presents for the following: Skin Problem (Check spot on right hip area x 2. One spot on the back and left hip.  Patient says its irritated. ).  growths Location: Right hip and left back Duration: Months Quality: Larger Associated Signs/Symptoms: Modifying Factors:  Severity:  Timing: Context:   The following portions of the chart were reviewed this encounter and updated as appropriate:     Objective  Well appearing patient in no apparent distress; mood and affect are within normal limits.  All skin waist up examined.   Assessment & Plan  Neoplasm of skin (2) Right Inguinal Area  Skin / nail biopsy Type of biopsy: tangential   Anesthesia: the lesion was anesthetized in a standard fashion   Anesthetic:  1% lidocaine w/ epinephrine 1-100,000 local infiltration Instrument used: flexible razor blade   Hemostasis achieved with: ferric subsulfate   Outcome: patient tolerated procedure well   Post-procedure details: sterile dressing applied and wound care instructions given   Dressing type: petrolatum   Additional details:  Patient identified lesion of concern.  Lesion identified by physician.  Specimen 1 - Surgical pathology Differential Diagnosis: scc vs bcc Check Margins: No  Left Lower Back  Skin / nail biopsy Type of biopsy: tangential   Anesthesia: the lesion was anesthetized in a standard fashion   Anesthetic:  1% lidocaine w/ epinephrine 1-100,000 local infiltration Instrument used: flexible razor blade   Hemostasis achieved with: ferric subsulfate   Outcome: patient tolerated procedure well   Post-procedure details: sterile dressing applied and wound care instructions given   Dressing type: petrolatum   Additional details:  Patient identified lesion of concern.  Lesion identified by physician.  Specimen 2 - Surgical pathology Differential Diagnosis: scc vs bcc Check Margins: No

## 2020-06-09 ENCOUNTER — Other Ambulatory Visit: Payer: Self-pay

## 2020-06-09 ENCOUNTER — Encounter: Payer: Self-pay | Admitting: Internal Medicine

## 2020-06-09 ENCOUNTER — Ambulatory Visit (INDEPENDENT_AMBULATORY_CARE_PROVIDER_SITE_OTHER): Payer: 59 | Admitting: Internal Medicine

## 2020-06-09 VITALS — BP 126/74 | HR 57 | Temp 98.1°F | Ht 67.0 in | Wt 144.2 lb

## 2020-06-09 DIAGNOSIS — L255 Unspecified contact dermatitis due to plants, except food: Secondary | ICD-10-CM | POA: Diagnosis not present

## 2020-06-09 MED ORDER — PREDNISONE 20 MG PO TABS: 20.0000 mg | ORAL_TABLET | Freq: Every day | ORAL | 0 refills | Status: DC

## 2020-06-09 NOTE — Progress Notes (Signed)
Chief Complaint  Patient presents with  . Poison Ivy    pulled some weeds and now broken out in poison ivy all over her body    HPI: Anita Barnett 59 y.o. come in for [place] pulled some weeds.   Then yesterday head and wrist   And now up arm.   Dr [place] gave her cream  tmc but spreadiing all 4 extremities and face and checn neck  No resp sx and no edema  ROS: See pertinent positives and negatives per HPI.  Past Medical History:  Diagnosis Date  . Colon polyps   . History of shingles   . Hx of varicella   . Idiopathic parathyroidism ([place])   . Seasonal allergies     Family History  Problem Relation Age of Onset  . COPD Mother        smoker  . Heart disease Father        CABG x 3  . Diabetes Maternal Grandfather        type 1    Social History   Socioeconomic History  . Marital status: Married    Spouse name: Not on file  . Number of children: 2  . Years of education: Not on file  . Highest education level: Not on file  Occupational History  . Occupation: cpa  Tobacco Use  . Smoking status: Never Smoker  . Smokeless tobacco: Never Used  Vaping Use  . Vaping Use: Never used  Substance and Sexual Activity  . Alcohol use: Yes    Alcohol/week: 2.0 - 3.0 standard drinks    Types: 2 - 3 Cans of beer per week    Comment: occ  . Drug use: No  . Sexual activity: Yes    Partners: Male    Birth control/protection: Post-menopausal  Other Topics Concern  . 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    Lives with her husband.    Has 2 children in college.   G2P2   nneg ets fa   Social Determinants of Health   Financial Resource Strain:   . Difficulty of Paying Living Expenses:   Food Insecurity:   . Worried About Charity fundraiser in the Last Year:   . Arboriculturist in the Last Year:   Transportation Needs:   . Film/video editor (Medical):   Marland Kitchen Lack of  Transportation (Non-Medical):   Physical Activity:   . Days of Exercise per Week:   . Minutes of Exercise per Session:   Stress:   . Feeling of Stress :   Social Connections:   . Frequency of Communication with Friends and Family:   . Frequency of Social Gatherings with Friends and Family:   . Attends Religious Services:   . Active Member of Clubs or Organizations:   . Attends Archivist Meetings:   Marland Kitchen Marital Status:     Outpatient Medications Prior to Visit  Medication Sig Dispense Refill  . Cholecalciferol (VITAMIN D PO) Take 1 tablet by mouth daily.    Marland Kitchen ibuprofen (ADVIL) 200 MG tablet ibuprofen  prn    . triamcinolone cream (KENALOG) 0.1 % SMARTSIG:1 Application Topical 2-3 Times Daily     No facility-administered medications prior to visit.     EXAM:  BP 126/74   Pulse Marland Kitchen)  57   Temp 98.1 F (36.7 C) (Temporal)   Ht 5\' 7"  (1.702 m)   Wt 144 lb 3.2 oz (65.4 kg)   LMP 12/26/2009 (Approximate)   SpO2 98%   BMI 22.58 kg/m   Body mass index is 22.58 kg/m.  GENERAL: vitals reviewed and listed above, alert, oriented, appears well hydrated and in no acute distress HEENT: atraumatic, conjunctiva  clear, no obvious abnormalities on inspection of external nose and ears OP :masked NECK: no obvious masses on inspection palpation  Skin contact derm face left neck chin 3 extremities   Some blistering left arm  No infection  CV: HRRR, no clubbing cyanosis or  peripheral edema nl cap refill  MS: moves all extremities without noticeable focal  abnormality PSYCH: pleasant and cooperative, no obvious depression or anxiety  BP Readings from Last 3 Encounters:  06/09/20 126/74  11/27/19 120/64  10/23/18 120/84    ASSESSMENT AND PLAN:  Discussed the following assessment and plan:  Plant dermatitis Cont to spread   All extremities and face     Expectant management. And oral prednisone with taper   As tolerated   12 day or less  Prevention can use topical for early  lesion and disc prevention No CI to pred steroids at this time -Patient advised to return or notify health care team  if  new concerns arise.  Patient Instructions  I agree this is contact dermatitis .    Prednisone   Taper   And can use topical on new areas.    Poison Ivy Dermatitis Poison ivy dermatitis is inflammation of the skin that is caused by chemicals in the leaves of the poison ivy plant. The skin reaction often involves redness, swelling, blisters, and extreme itching. What are the causes? This condition is caused by a chemical (urushiol) found in the sap of the poison ivy plant. This chemical is sticky and can be easily spread to people, animals, and objects. You can get poison ivy dermatitis by:  Having direct contact with a poison ivy plant.  Touching animals, other people, or objects that have come in contact with poison ivy and have the chemical on them. What increases the risk? This condition is more likely to develop in people who:  Are outdoors often in wooded or [place] areas.  Go outdoors without wearing protective clothing, such as closed shoes, long pants, and a long-sleeved shirt. What are the signs or symptoms? Symptoms of this condition include:  Redness of the skin.  Extreme itching.  A rash that often includes bumps and blisters. The rash usually appears 48 hours after exposure, if you have been exposed before. If this is the first time you have been exposed, the rash may not appear until a week after exposure.  Swelling. This may occur if the reaction is more severe. Symptoms usually last for 1-2 weeks. However, the first time you develop this condition, symptoms may last 3-4 weeks. How is this diagnosed? This condition may be diagnosed based on your symptoms and a physical exam. Your health care provider may also ask you about any recent outdoor activity. How is this treated? Treatment for this condition will vary depending on how severe it is.  Treatment may include:  Hydrocortisone cream or calamine lotion to relieve itching.  Oatmeal baths to soothe the skin.  Medicines, such as over-the-counter antihistamine tablets.  Oral steroid medicine, for more severe reactions. Follow these instructions at home: Medicines  Take or apply over-the-counter and prescription medicines  only as told by your health care provider.  Use hydrocortisone cream or calamine lotion as needed to soothe the skin and relieve itching. General instructions  Do not scratch or rub your skin.  Apply a cold, wet cloth (cold compress) to the affected areas or take baths in cool water. This will help with itching. Avoid hot baths and showers.  Take oatmeal baths as needed. Use colloidal oatmeal. You can get this at your local pharmacy or grocery store. Follow the instructions on the packaging.  While you have the rash, wash clothes right after you wear them.  Keep all follow-up visits as told by your health care provider. This is important. How is this prevented?   Learn to identify the poison ivy plant and avoid contact with the plant. This plant can be recognized by the number of leaves. Generally, poison ivy has three leaves with flowering branches on a single stem. The leaves are typically glossy, and they have jagged edges that come to a point at the front.  If you have been exposed to poison ivy, thoroughly wash with soap and water right away. You have about 30 minutes to remove the plant resin before it will cause the rash. Be sure to wash under your fingernails, because any plant resin there will continue to spread the rash.  When hiking or camping, wear clothes that will help you to avoid exposure on the skin. This includes long pants, a long-sleeved shirt, tall socks, and hiking boots. You can also apply preventive lotion to your skin to help limit exposure.  If you suspect that your clothes or outdoor gear came in contact with poison ivy, rinse  them off outside with a garden hose before you bring them inside your house.  When doing yard work or gardening, wear gloves, long sleeves, long pants, and boots. Wash your garden tools and gloves if they come in contact with poison ivy.  If you suspect that your pet has come into contact with poison ivy, wash him or her with pet shampoo and water. Make sure to wear gloves while washing your pet. Contact a health care provider if you have:  Open sores in the rash area.  More redness, swelling, or pain in the affected area.  Redness that spreads beyond the rash area.  Fluid, blood, or pus coming from the affected area.  A fever.  A rash over a large area of your body.  A rash on your eyes, mouth, or genitals.  A rash that does not improve after a few weeks. Get help right away if:  Your face swells or your eyes swell shut.  You have trouble breathing.  You have trouble swallowing. These symptoms may represent a serious problem that is an emergency. Do not wait to see if the symptoms will go away. Get medical help right away. Call your local emergency services (911 in the [place] Do not drive yourself to the hospital. Summary  Poison ivy dermatitis is inflammation of the skin that is caused by chemicals in the leaves of the poison ivy plant.  Symptoms of this condition include redness, itching, a rash, and swelling.  Do not scratch or rub your skin.  Take or apply over-the-counter and prescription medicines only as told by your health care provider. This information is not intended to replace advice given to you by your health care provider. Make sure you discuss any questions you have with your health care provider. Document Revised: 04/05/2019 Document Reviewed: 12/07/2018 Elsevier  Patient Education  2020 [place] Ryker Pherigo M.D.

## 2020-06-09 NOTE — Patient Instructions (Addendum)
I agree this is contact dermatitis .    Prednisone   Taper   And can use topical on new areas.    Poison Ivy Dermatitis Poison ivy dermatitis is inflammation of the skin that is caused by chemicals in the leaves of the poison ivy plant. The skin reaction often involves redness, swelling, blisters, and extreme itching. What are the causes? This condition is caused by a chemical (urushiol) found in the sap of the poison ivy plant. This chemical is sticky and can be easily spread to people, animals, and objects. You can get poison ivy dermatitis by:  Having direct contact with a poison ivy plant.  Touching animals, other people, or objects that have come in contact with poison ivy and have the chemical on them. What increases the risk? This condition is more likely to develop in people who:  Are outdoors often in wooded or [place] areas.  Go outdoors without wearing protective clothing, such as closed shoes, long pants, and a long-sleeved shirt. What are the signs or symptoms? Symptoms of this condition include:  Redness of the skin.  Extreme itching.  A rash that often includes bumps and blisters. The rash usually appears 48 hours after exposure, if you have been exposed before. If this is the first time you have been exposed, the rash may not appear until a week after exposure.  Swelling. This may occur if the reaction is more severe. Symptoms usually last for 1-2 weeks. However, the first time you develop this condition, symptoms may last 3-4 weeks. How is this diagnosed? This condition may be diagnosed based on your symptoms and a physical exam. Your health care provider may also ask you about any recent outdoor activity. How is this treated? Treatment for this condition will vary depending on how severe it is. Treatment may include:  Hydrocortisone cream or calamine lotion to relieve itching.  Oatmeal baths to soothe the skin.  Medicines, such as over-the-counter antihistamine  tablets.  Oral steroid medicine, for more severe reactions. Follow these instructions at home: Medicines  Take or apply over-the-counter and prescription medicines only as told by your health care provider.  Use hydrocortisone cream or calamine lotion as needed to soothe the skin and relieve itching. General instructions  Do not scratch or rub your skin.  Apply a cold, wet cloth (cold compress) to the affected areas or take baths in cool water. This will help with itching. Avoid hot baths and showers.  Take oatmeal baths as needed. Use colloidal oatmeal. You can get this at your local pharmacy or grocery store. Follow the instructions on the packaging.  While you have the rash, wash clothes right after you wear them.  Keep all follow-up visits as told by your health care provider. This is important. How is this prevented?   Learn to identify the poison ivy plant and avoid contact with the plant. This plant can be recognized by the number of leaves. Generally, poison ivy has three leaves with flowering branches on a single stem. The leaves are typically glossy, and they have jagged edges that come to a point at the front.  If you have been exposed to poison ivy, thoroughly wash with soap and water right away. You have about 30 minutes to remove the plant resin before it will cause the rash. Be sure to wash under your fingernails, because any plant resin there will continue to spread the rash.  When hiking or camping, wear clothes that will help you to avoid  exposure on the skin. This includes long pants, a long-sleeved shirt, tall socks, and hiking boots. You can also apply preventive lotion to your skin to help limit exposure.  If you suspect that your clothes or outdoor gear came in contact with poison ivy, rinse them off outside with a garden hose before you bring them inside your house.  When doing yard work or gardening, wear gloves, long sleeves, long pants, and boots. Wash your  garden tools and gloves if they come in contact with poison ivy.  If you suspect that your pet has come into contact with poison ivy, wash him or her with pet shampoo and water. Make sure to wear gloves while washing your pet. Contact a health care provider if you have:  Open sores in the rash area.  More redness, swelling, or pain in the affected area.  Redness that spreads beyond the rash area.  Fluid, blood, or pus coming from the affected area.  A fever.  A rash over a large area of your body.  A rash on your eyes, mouth, or genitals.  A rash that does not improve after a few weeks. Get help right away if:  Your face swells or your eyes swell shut.  You have trouble breathing.  You have trouble swallowing. These symptoms may represent a serious problem that is an emergency. Do not wait to see if the symptoms will go away. Get medical help right away. Call your local emergency services (911 in the [place] Do not drive yourself to the hospital. Summary  Poison ivy dermatitis is inflammation of the skin that is caused by chemicals in the leaves of the poison ivy plant.  Symptoms of this condition include redness, itching, a rash, and swelling.  Do not scratch or rub your skin.  Take or apply over-the-counter and prescription medicines only as told by your health care provider. This information is not intended to replace advice given to you by your health care provider. Make sure you discuss any questions you have with your health care provider. Document Revised: 04/05/2019 Document Reviewed: 12/07/2018 Elsevier Patient Education  2020 [employer].

## 2021-08-04 LAB — HM MAMMOGRAPHY

## 2021-08-09 ENCOUNTER — Encounter: Payer: Self-pay | Admitting: Internal Medicine

## 2021-08-12 ENCOUNTER — Telehealth: Payer: Self-pay | Admitting: Obstetrics and Gynecology

## 2021-08-12 NOTE — Telephone Encounter (Signed)
Left message in voice mail for patient to call. 

## 2021-08-12 NOTE — Telephone Encounter (Signed)
Please contact patient and offer to schedule an annual exam at our [facility] office.   I received a copy of her mammogram from [place], which was normal.   She was a patient of [facility] at [facility].  She has not been seen since 2018 for an office visit.

## 2021-08-20 LAB — HM DEXA SCAN

## 2021-08-25 ENCOUNTER — Encounter: Payer: Self-pay | Admitting: Internal Medicine

## 2021-08-25 NOTE — Telephone Encounter (Signed)
Patient is scheduled for Physical with Dr. Regis Bill on 09/01/21.

## 2021-08-31 ENCOUNTER — Other Ambulatory Visit: Payer: Self-pay

## 2021-08-31 NOTE — Progress Notes (Signed)
Chief Complaint  Patient presents with   Annual Exam     HPI: Patient  Anita Barnett  60 y.o. comes in today for [place] visit  Doing well  Some dry feeling in throat since  tonsil removal  but not sore or problem  Had dexa scan  Health Maintenance  Topic Date Due   PAP SMEAR-Modifier  01/15/2018   COVID-19 Vaccine (4 - Booster for Pfizer series) 03/23/2021   MAMMOGRAM  08/05/2023   COLONOSCOPY (Pts 45-94yr Insurance coverage will need to be confirmed)  12/09/2024   TETANUS/TDAP  01/15/2025   INFLUENZA VACCINE  Completed   Hepatitis C Screening  Completed   HIV Screening  Completed   Zoster Vaccines- Shingrix  Completed   Pneumococcal Vaccine [id number] old  Aged Out   HPV VACCINES  Aged Out   Health Maintenance Review LIFESTYLE:  Exercise:   Tobacco/ETS:n Alcohol: ocass Sugar beverages: Sleep:7-8 hours  Drug use: no HH of 3  3 pets  Work:remote 20 hours  self empployed   To have gyne exam   ROS:  REST of 12 system review negative except as per HPI   Past Medical History:  Diagnosis Date   Colon polyps    History of shingles    Hx of varicella    Idiopathic parathyroidism ([facility]olic    Seasonal allergies     Past Surgical History:  Procedure Laterality Date   PARATHYROIDECTOMY N/A 11/23/2015   Procedure: LEFT INFERIOR PARATHYROIDECTOMY;  Surgeon: TArmandina Gemma MD;  [place] [facility]  Service: General;  Laterality: N/A;   TOE SURGERY Left 1996   pin in little toe   TONSILLECTOMY  2019    Family History  Problem Relation Age of Onset   COPD Mother        smoker   Heart disease Father        CABG x 3   Diabetes Maternal Grandfather        type 1    Social History   Socioeconomic History   Marital status: Married    Spouse name: Not on file   Number of children: 2   Years of education: Not on file   Highest education level: Not on file  Occupational History   Occupation: cpa  Tobacco Use   Smoking status: Never   Smokeless  tobacco: Never  Vaping Use   Vaping Use: Never used  Substance and Sexual Activity   Alcohol use: Yes    Alcohol/week: 2.0 - 3.0 standard drinks    Types: 2 - 3 Cans of beer per week    Comment: occ   Drug use: No   Sexual activity: Yes    Partners: Male    Birth control/protection: Post-menopausal  Other Topics Concern   Not on file  Social History Narrative   7 hours of sleep per night   Works part time as a CEngineer, 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    Lives with her husband.    Has 2 children in college.   G2P2   nneg ets fa   Social Determinants of Health   Financial Resource Strain: Not on file  Food Insecurity: Not on file  Transportation Needs: Not on file  Physical Activity: Not on file  Stress: Not on file  Social Connections: Not on file    Outpatient Medications Prior to Visit  Medication Sig Dispense Refill   Cholecalciferol (VITAMIN D PO) Take 1 tablet by mouth daily.  ibuprofen (ADVIL) 200 MG tablet ibuprofen  prn     triamcinolone cream (KENALOG) 0.1 % SMARTSIG:1 Application Topical 2-3 Times Daily     predniSONE (DELTASONE) 20 MG tablet Take 1 tablet (20 mg total) by mouth daily. Take 3,3,3,2,2,2,1,1,1, 1/.2 1./2 1/.2 pills qd 24 tablet 0   No facility-administered medications prior to visit.     EXAM:  BP 110/66 (BP Location: Left Arm, Patient Position: Sitting, Cuff Size: Normal)   Pulse (!) 56   Temp 98.2 F (36.8 C) (Oral)   Ht 5' 6.03" (1.677 m)   Wt 158 lb 3.2 oz (71.8 kg)   LMP 12/26/2009 (Approximate)   SpO2 98%   BMI 25.51 kg/m   Body mass index is 25.51 kg/m. Wt Readings from Last 3 Encounters:  09/01/21 158 lb 3.2 oz (71.8 kg)  06/09/20 144 lb 3.2 oz (65.4 kg)  11/27/19 156 lb 6.4 oz (70.9 kg)    Physical Exam: Vital signs reviewed [id number] is a well-developed well-nourished alert cooperative    who appearsr stated age in no acute distress.  HEENT: normocephalic atraumatic , Eyes: PERRL EOM's full, conjunctiva clear, Nares:  paten,t no deformity discharge or tenderness., Ears: no deformity EAC's clear TMs with normal landmarks. Mouth: masked  but op no lesion  noted  NECK: supple without masses, thyromegaly or bruits. CHEST/PULM:  Clear to auscultation and percussion breath sounds equal no wheeze , rales or rhonchi. No chest wall deformities or tenderness. Breast: normal by inspection . No dimpling, discharge, masses, tenderness or discharge . CV: PMI is nondisplaced, S1 S2 no gallops, murmurs, rubs. Peripheral pulses are full without delay.No JVD .  ABDOMEN: Bowel sounds normal nontender  No guard or rebound, no hepato splenomegal no CVA tenderness.   Extremtities:  No clubbing cyanosis or edema, no acute joint swelling or redness no focal atrophy NEURO:  Oriented x3, cranial nerves 3-12 appear to be intact, no obvious focal weakness,gait within normal limits no abnormal reflexes or asymmetrical SKIN: No acute rashes normal turgor, color, no bruising or petechiae.upper back 1-2 mm verroucoul like   small. ( Will have derm check at [facility] if progresses) PSYCH: Oriented, good eye contact, no obvious depression anxiety, cognition and judgment appear normal. LN: no cervical axillary inguinal adenopathy  Lab Results  Component Value Date   WBC 7.7 11/27/2019   HGB 13.4 11/27/2019   HCT 40.4 11/27/2019   PLT 310.0 11/27/2019   GLUCOSE 86 11/27/2019   CHOL 147 11/27/2019   TRIG 42.0 11/27/2019   HDL 54.70 11/27/2019   LDLCALC 83 11/27/2019   ALT 44 (H) 11/27/2019   AST 29 11/27/2019   NA 139 11/27/2019   K 4.4 11/27/2019   CL 102 11/27/2019   CREATININE 0.78 11/27/2019   BUN 22 11/27/2019   CO2 29 11/27/2019   TSH 1.56 11/27/2019    BP Readings from Last 3 Encounters:  09/01/21 110/66  06/09/20 126/74  11/27/19 120/64   Dexa reviewed --0.6 rfn and -0.5 LFN   Spine  -1.3 tscore Lab plan reviewed with patient   ASSESSMENT AND PLAN:  Discussed the following assessment and plan:    ICD-10-CM   1.  Visit for preventive health examination  [id number] Basic metabolic panel    CBC with Differential/Platelet    Hepatic function panel    Lipid panel    TSH    Hemoglobin A1c    2. Low bone density  [id number] Basic metabolic panel    CBC with Differential/Platelet  Hepatic function panel    Lipid panel    TSH    Hemoglobin A1c   dexa in 2-3 years and  cont weight bearing exercises    3. Hyperlipidemia, unspecified hyperlipidemia type  [id number] Basic metabolic panel    CBC with Differential/Platelet    Hepatic function panel    Lipid panel    TSH    Hemoglobin A1c    4. Abnormal LFTs  [id number] Basic metabolic panel    CBC with Differential/Platelet    Hepatic function panel    Lipid panel    TSH    Hemoglobin A1c   minor change  last check hx of neg hep c serology    5. Need for shingles vaccine  Z23 Varicella-zoster vaccine IM    6. Need for influenza vaccination  Z23 Flu Vaccine QUAD 6+ mos PF IM (Fluarix Quad PF)    Plan future labs fasting   [facility] if throat sx are progressive.  Return in about 1 year (around 09/01/2022) for depending on results.  Patient Care Team: Shawndell Schillaci, Standley Brooking, MD as PCP - General (Internal Medicine) Kem Boroughs, FNP as Nurse Practitioner (Nurse Practitioner) Juanita Craver, MD as Consulting Physician (Gastroenterology) Lavonna Monarch, MD as Consulting Physician (Dermatology) Patient Instructions  Good to see you today .   Continue lifestyle intervention healthy eating and exercise . Weight bearing exercise . Repeat  Dexa in 2-3 years .   Shingrix 2 and flu vaccine today . Get appt for lab .   Health Maintenance, Female Adopting a healthy lifestyle and getting preventive care are important in promoting health and wellness. Ask your health care provider about: The right schedule for you to have regular tests and exams. Things you can do on your own to prevent diseases and keep yourself healthy. What should I know about diet, weight, and  exercise? Eat a healthy diet  Eat a diet that includes plenty of vegetables, fruits, low-fat dairy products, and lean protein. Do not eat a lot of foods that are high in solid fats, added sugars, or sodium. Maintain a healthy weight Body mass index (BMI) is used to identify weight problems. It estimates body fat based on height and weight. Your health care provider can help determine your BMI and help you achieve or maintain a healthy weight. Get regular exercise Get regular exercise. This is one of the most important things you can do for your health. Most adults should: Exercise for at least 150 minutes each week. The exercise should increase your heart rate and make you sweat (moderate-intensity exercise). Do strengthening exercises at least twice a week. This is in addition to the moderate-intensity exercise. Spend less time sitting. Even light physical activity can be beneficial. Watch cholesterol and blood lipids Have your blood tested for lipids and cholesterol at 60 years of age, then have this test every 5 years. Have your cholesterol levels checked more often if: Your lipid or cholesterol levels are high. You are older than 60 years of age. You are at high risk for heart disease. What should I know about cancer screening? Depending on your health history and family history, you may need to have cancer screening at various ages. This may include screening for: Breast cancer. Cervical cancer. Colorectal cancer. Skin cancer. Lung cancer. What should I know about heart disease, diabetes, and high blood pressure? Blood pressure and heart disease High blood pressure causes heart disease and increases the risk of stroke. This is more likely to develop  in people who have high blood pressure readings, are of African descent, or are overweight. Have your blood pressure checked: Every 3-5 years if you are 70-53 years of age. Every year if you are 2 years old or older. Diabetes Have  regular diabetes screenings. This checks your fasting blood sugar level. Have the screening done: Once every three years after age 19 if you are at a normal weight and have a low risk for diabetes. More often and at a younger age if you are overweight or have a high risk for diabetes. What should I know about preventing infection? Hepatitis B If you have a higher risk for hepatitis B, you should be screened for this virus. Talk with your health care provider to find out if you are at risk for hepatitis B infection. Hepatitis C Testing is recommended for: Everyone born from [birth year] through 1965. Anyone with known risk factors for hepatitis C. Sexually transmitted infections (STIs) Get screened for STIs, including gonorrhea and chlamydia, if: You are sexually active and are younger than 60 years of age. You are older than 60 years of age and your health care provider tells you that you are at risk for this type of infection. Your sexual activity has changed since you were last screened, and you are at increased risk for chlamydia or gonorrhea. Ask your health care provider if you are at risk. Ask your health care provider about whether you are at high risk for HIV. Your health care provider may recommend a prescription medicine to help prevent HIV infection. If you choose to take medicine to prevent HIV, you should first get tested for HIV. You should then be tested every 3 months for as long as you are taking the medicine. Pregnancy If you are about to stop having your period (premenopausal) and you may become pregnant, seek counseling before you get pregnant. Take 400 to 800 micrograms (mcg) of folic acid every day if you become pregnant. Ask for birth control (contraception) if you want to prevent pregnancy. Osteoporosis and menopause Osteoporosis is a disease in which the bones lose minerals and strength with aging. This can result in bone fractures. If you are 60 years old or older, or if you  are at risk for osteoporosis and fractures, ask your health care provider if you should: Be screened for bone loss. Take a calcium or vitamin D supplement to lower your risk of fractures. Be given hormone replacement therapy (HRT) to treat symptoms of menopause. Follow these instructions at home: Lifestyle Do not use any products that contain nicotine or tobacco, such as cigarettes, e-cigarettes, and chewing tobacco. If you need help quitting, ask your health care provider. Do not use street drugs. Do not share needles. Ask your health care provider for help if you need support or information about quitting drugs. Alcohol use Do not drink alcohol if: Your health care provider tells you not to drink. You are pregnant, may be pregnant, or are planning to become pregnant. If you drink alcohol: Limit how much you use to 0-1 drink a day. Limit intake if you are breastfeeding. Be aware of how much alcohol is in your drink. In the [place] one drink equals one 12 oz bottle of beer (355 mL), one 5 oz glass of wine (148 mL), or one 1 oz glass of hard liquor (44 mL). General instructions Schedule regular health, dental, and eye exams. Stay current with your vaccines. Tell your health care provider if: You often feel depressed. You  have ever been abused or do not feel safe at home. Summary Adopting a healthy lifestyle and getting preventive care are important in promoting health and wellness. Follow your health care provider's instructions about healthy diet, exercising, and getting tested or screened for diseases. Follow your health care provider's instructions on monitoring your cholesterol and blood pressure. This information is not intended to replace advice given to you by your health care provider. Make sure you discuss any questions you have with your health care provider. Document Revised: 02/19/2021 Document Reviewed: 12/05/2018 Elsevier Patient Education  2022 [place].  Caterra Ostroff M.D.

## 2021-09-01 ENCOUNTER — Encounter: Payer: Self-pay | Admitting: Internal Medicine

## 2021-09-01 ENCOUNTER — Ambulatory Visit (INDEPENDENT_AMBULATORY_CARE_PROVIDER_SITE_OTHER): Payer: 59 | Admitting: Internal Medicine

## 2021-09-01 VITALS — BP 110/66 | HR 56 | Temp 98.2°F | Ht 66.03 in | Wt 158.2 lb

## 2021-09-01 DIAGNOSIS — E785 Hyperlipidemia, unspecified: Secondary | ICD-10-CM | POA: Diagnosis not present

## 2021-09-01 DIAGNOSIS — R7989 Other specified abnormal findings of blood chemistry: Secondary | ICD-10-CM

## 2021-09-01 DIAGNOSIS — Z0000 Encounter for general adult medical examination without abnormal findings: Secondary | ICD-10-CM | POA: Diagnosis not present

## 2021-09-01 DIAGNOSIS — R945 Abnormal results of liver function studies: Secondary | ICD-10-CM | POA: Diagnosis not present

## 2021-09-01 DIAGNOSIS — M859 Disorder of bone density and structure, unspecified: Secondary | ICD-10-CM

## 2021-09-01 DIAGNOSIS — Z23 Encounter for immunization: Secondary | ICD-10-CM

## 2021-09-01 DIAGNOSIS — M8580 Other specified disorders of bone density and structure, unspecified site: Secondary | ICD-10-CM

## 2021-09-01 NOTE — Patient Instructions (Signed)
Good to see you today .   Continue lifestyle intervention healthy eating and exercise . Weight bearing exercise . Repeat  Dexa in 2-3 years .   Shingrix 2 and flu vaccine today . Get appt for lab .   Health Maintenance, Female Adopting a healthy lifestyle and getting preventive care are important in promoting health and wellness. Ask your health care provider about: The right schedule for you to have regular tests and exams. Things you can do on your own to prevent diseases and keep yourself healthy. What should I know about diet, weight, and exercise? Eat a healthy diet  Eat a diet that includes plenty of vegetables, fruits, low-fat dairy products, and lean protein. Do not eat a lot of foods that are high in solid fats, added sugars, or sodium. Maintain a healthy weight Body mass index (BMI) is used to identify weight problems. It estimates body fat based on height and weight. Your health care provider can help determine your BMI and help you achieve or maintain a healthy weight. Get regular exercise Get regular exercise. This is one of the most important things you can do for your health. Most adults should: Exercise for at least 150 minutes each week. The exercise should increase your heart rate and make you sweat (moderate-intensity exercise). Do strengthening exercises at least twice a week. This is in addition to the moderate-intensity exercise. Spend less time sitting. Even light physical activity can be beneficial. Watch cholesterol and blood lipids Have your blood tested for lipids and cholesterol at 60 years of age, then have this test every 5 years. Have your cholesterol levels checked more often if: Your lipid or cholesterol levels are high. You are older than 60 years of age. You are at high risk for heart disease. What should I know about cancer screening? Depending on your health history and family history, you may need to have cancer screening at various ages. This may  include screening for: Breast cancer. Cervical cancer. Colorectal cancer. Skin cancer. Lung cancer. What should I know about heart disease, diabetes, and high blood pressure? Blood pressure and heart disease High blood pressure causes heart disease and increases the risk of stroke. This is more likely to develop in people who have high blood pressure readings, are of African descent, or are overweight. Have your blood pressure checked: Every 3-5 years if you are 20-26 years of age. Every year if you are 69 years old or older. Diabetes Have regular diabetes screenings. This checks your fasting blood sugar level. Have the screening done: Once every three years after age 81 if you are at a normal weight and have a low risk for diabetes. More often and at a younger age if you are overweight or have a high risk for diabetes. What should I know about preventing infection? Hepatitis B If you have a higher risk for hepatitis B, you should be screened for this virus. Talk with your health care provider to find out if you are at risk for hepatitis B infection. Hepatitis C Testing is recommended for: Everyone born from [birth year] through 1965. Anyone with known risk factors for hepatitis C. Sexually transmitted infections (STIs) Get screened for STIs, including gonorrhea and chlamydia, if: You are sexually active and are younger than 60 years of age. You are older than 60 years of age and your health care provider tells you that you are at risk for this type of infection. Your sexual activity has changed since you were last screened,  and you are at increased risk for chlamydia or gonorrhea. Ask your health care provider if you are at risk. Ask your health care provider about whether you are at high risk for HIV. Your health care provider may recommend a prescription medicine to help prevent HIV infection. If you choose to take medicine to prevent HIV, you should first get tested for HIV. You should then  be tested every 3 months for as long as you are taking the medicine. Pregnancy If you are about to stop having your period (premenopausal) and you may become pregnant, seek counseling before you get pregnant. Take 400 to 800 micrograms (mcg) of folic acid every day if you become pregnant. Ask for birth control (contraception) if you want to prevent pregnancy. Osteoporosis and menopause Osteoporosis is a disease in which the bones lose minerals and strength with aging. This can result in bone fractures. If you are 84 years old or older, or if you are at risk for osteoporosis and fractures, ask your health care provider if you should: Be screened for bone loss. Take a calcium or vitamin D supplement to lower your risk of fractures. Be given hormone replacement therapy (HRT) to treat symptoms of menopause. Follow these instructions at home: Lifestyle Do not use any products that contain nicotine or tobacco, such as cigarettes, e-cigarettes, and chewing tobacco. If you need help quitting, ask your health care provider. Do not use street drugs. Do not share needles. Ask your health care provider for help if you need support or information about quitting drugs. Alcohol use Do not drink alcohol if: Your health care provider tells you not to drink. You are pregnant, may be pregnant, or are planning to become pregnant. If you drink alcohol: Limit how much you use to 0-1 drink a day. Limit intake if you are breastfeeding. Be aware of how much alcohol is in your drink. In the [place] one drink equals one 12 oz bottle of beer (355 mL), one 5 oz glass of wine (148 mL), or one 1 oz glass of hard liquor (44 mL). General instructions Schedule regular health, dental, and eye exams. Stay current with your vaccines. Tell your health care provider if: You often feel depressed. You have ever been abused or do not feel safe at home. Summary Adopting a healthy lifestyle and getting preventive care are  important in promoting health and wellness. Follow your health care provider's instructions about healthy diet, exercising, and getting tested or screened for diseases. Follow your health care provider's instructions on monitoring your cholesterol and blood pressure. This information is not intended to replace advice given to you by your health care provider. Make sure you discuss any questions you have with your health care provider. Document Revised: 02/19/2021 Document Reviewed: 12/05/2018 Elsevier Patient Education  2022 [employer].

## 2021-09-02 ENCOUNTER — Other Ambulatory Visit: Payer: Self-pay

## 2021-09-02 ENCOUNTER — Other Ambulatory Visit (INDEPENDENT_AMBULATORY_CARE_PROVIDER_SITE_OTHER): Payer: 59

## 2021-09-02 DIAGNOSIS — Z0000 Encounter for general adult medical examination without abnormal findings: Secondary | ICD-10-CM

## 2021-09-02 DIAGNOSIS — M8580 Other specified disorders of bone density and structure, unspecified site: Secondary | ICD-10-CM

## 2021-09-02 DIAGNOSIS — R945 Abnormal results of liver function studies: Secondary | ICD-10-CM | POA: Diagnosis not present

## 2021-09-02 DIAGNOSIS — E785 Hyperlipidemia, unspecified: Secondary | ICD-10-CM

## 2021-09-02 DIAGNOSIS — R7989 Other specified abnormal findings of blood chemistry: Secondary | ICD-10-CM

## 2021-09-02 DIAGNOSIS — M859 Disorder of bone density and structure, unspecified: Secondary | ICD-10-CM

## 2021-09-02 LAB — BASIC METABOLIC PANEL
BUN: 25 mg/dL — ABNORMAL HIGH (ref 6–23)
CO2: 28 mEq/L (ref 19–32)
Calcium: 9.4 mg/dL (ref 8.4–10.5)
Chloride: 106 mEq/L (ref 96–112)
Creatinine, Ser: 0.82 mg/dL (ref 0.40–1.20)
GFR: 78.09 mL/min (ref >60.00)
Glucose, Bld: 83 mg/dL (ref 70–99)
Potassium: 4.6 mEq/L (ref 3.5–5.1)
Sodium: 141 mEq/L (ref 135–145)

## 2021-09-02 LAB — TSH: TSH: 3.49 u[IU]/mL (ref 0.35–5.50)

## 2021-09-02 LAB — HEPATIC FUNCTION PANEL
ALT: 14 U/L (ref 0–35)
AST: 17 U/L (ref 0–37)
Albumin: 4.2 g/dL (ref 3.5–5.2)
Alkaline Phosphatase: 73 U/L (ref 39–117)
Bilirubin, Direct: 0.1 mg/dL (ref 0.0–0.3)
Total Bilirubin: 0.6 mg/dL (ref 0.2–1.2)
Total Protein: 7.2 g/dL (ref 6.0–8.3)

## 2021-09-02 LAB — CBC WITH DIFFERENTIAL/PLATELET
Basophils Absolute: 0 10*3/uL (ref 0.0–0.1)
Basophils Relative: 0.7 % (ref 0.0–3.0)
Eosinophils Absolute: 0.1 10*3/uL (ref 0.0–0.7)
Eosinophils Relative: 1.3 % (ref 0.0–5.0)
HCT: 38.9 % (ref 36.0–46.0)
Hemoglobin: 13 g/dL (ref 12.0–15.0)
Lymphocytes Relative: 18.3 % (ref 12.0–46.0)
Lymphs Abs: 1.2 10*3/uL (ref 0.7–4.0)
MCHC: 33.5 g/dL (ref 30.0–36.0)
MCV: 93.6 fl (ref 78.0–100.0)
Monocytes Absolute: 0.5 10*3/uL (ref 0.1–1.0)
Monocytes Relative: 7.4 % (ref 3.0–12.0)
Neutro Abs: 4.6 10*3/uL (ref 1.4–7.7)
Neutrophils Relative %: 72.3 % (ref 43.0–77.0)
Platelets: 263 10*3/uL (ref 150.0–400.0)
RBC: 4.16 Mil/uL (ref 3.87–5.11)
RDW: 13.3 % (ref 11.5–15.5)
WBC: 6.3 10*3/uL (ref 4.0–10.5)

## 2021-09-02 LAB — LIPID PANEL
Cholesterol: 168 mg/dL (ref 0–200)
HDL: 67.3 mg/dL (ref >39.00)
LDL Cholesterol: 87 mg/dL (ref 0–99)
NonHDL: 100.52
Total CHOL/HDL Ratio: 2
Triglycerides: 66 mg/dL (ref 0.0–149.0)
VLDL: 13.2 mg/dL (ref 0.0–40.0)

## 2021-09-02 LAB — HEMOGLOBIN A1C: Hgb A1c MFr Bld: 5.5 % (ref 4.6–6.5)

## 2021-09-04 NOTE — Progress Notes (Signed)
Results  are normal  .  Elevated bun is most likely from fasting dehydrated state and a clinically insignificant finding

## 2021-09-07 ENCOUNTER — Telehealth: Payer: Self-pay

## 2021-09-07 NOTE — Telephone Encounter (Signed)
Patient returned call and was informed of results patient verbalized understanding and stated she reviewed results on MyChart.

## 2021-09-07 NOTE — Telephone Encounter (Signed)
Noted  

## 2022-05-27 ENCOUNTER — Telehealth: Payer: Self-pay | Admitting: Internal Medicine

## 2022-05-27 NOTE — Telephone Encounter (Signed)
Called pt to sched her next cpe w/ Dr. Regis Bill.   FYI

## 2022-08-17 LAB — HM MAMMOGRAPHY

## 2022-09-05 ENCOUNTER — Encounter: Payer: 59 | Admitting: Radiology

## 2022-09-13 ENCOUNTER — Encounter: Payer: 59 | Admitting: Radiology

## 2022-09-14 ENCOUNTER — Encounter: Payer: Self-pay | Admitting: Internal Medicine

## 2023-09-07 LAB — HM MAMMOGRAPHY

## 2023-09-11 ENCOUNTER — Encounter: Payer: Self-pay | Admitting: Internal Medicine

## 2024-08-28 NOTE — Progress Notes (Unsigned)
 No chief complaint on file.   HPI: Patient  Anita Barnett  63 y.o. comes in today for Preventive Health Care visit   Last pv  9 22   Health Maintenance  Topic Date Due   Pneumococcal Vaccine: 50+ Years (1 of 1 - PCV) Never done   Cervical Cancer Screening (HPV/Pap Cotest)  01/15/2018   INFLUENZA VACCINE  07/26/2024   COVID-19 Vaccine (4 - 2025-26 season) 08/26/2024   Colonoscopy  12/09/2024   DTaP/Tdap/Td (2 - Td or Tdap) 01/15/2025   MAMMOGRAM  09/06/2025   Hepatitis C Screening  Completed   HIV Screening  Completed   Zoster Vaccines- Shingrix   Completed   Hepatitis B Vaccines 19-59 Average Risk  Aged Out   HPV VACCINES  Aged Out   Meningococcal B Vaccine  Aged Out   Health Maintenance Review LIFESTYLE:  Exercise:   Tobacco/ETS: Alcohol:  Sugar beverages: Sleep: Drug use: no HH of  Work:    ROS:  GEN/ HEENT: No fever, significant weight changes sweats headaches vision problems hearing changes, CV/ PULM; No chest pain shortness of breath cough, syncope,edema  change in exercise tolerance. GI /GU: No adominal pain, vomiting, change in bowel habits. No blood in the stool. No significant GU symptoms. SKIN/HEME: ,no acute skin rashes suspicious lesions or bleeding. No lymphadenopathy, nodules, masses.  NEURO/ PSYCH:  No neurologic signs such as weakness numbness. No depression anxiety. IMM/ Allergy: No unusual infections.  Allergy .   REST of 12 system review negative except as per HPI   Past Medical History:  Diagnosis Date   Colon polyps    History of shingles    Hx of varicella    Idiopathic parathyroidism (HCC)    Seasonal allergies     Past Surgical History:  Procedure Laterality Date   PARATHYROIDECTOMY N/A 11/23/2015   Procedure: LEFT INFERIOR PARATHYROIDECTOMY;  Surgeon: Krystal Spinner, MD;  Location: [facility] OR;  Service: General;  Laterality: N/A;   TOE SURGERY Left 1996   pin in little toe   TONSILLECTOMY  2019    Family History  Problem Relation  Age of Onset   COPD Mother        smoker   Heart disease Father        CABG x 3   Diabetes Maternal Grandfather        type 1    Social History   Socioeconomic History   Marital status: Married    Spouse name: Not on file   Number of children: 2   Years of education: Not on file   Highest education level: Bachelor's degree (e.g., BA, AB, BS)  Occupational History   Occupation: cpa  Tobacco Use   Smoking status: Never   Smokeless tobacco: Never  Vaping Use   Vaping status: Never Used  Substance and Sexual Activity   Alcohol use: Yes    Alcohol/week: 2.0 - 3.0 standard drinks of alcohol    Types: 2 - 3 Cans of beer per week    Comment: occ   Drug use: No   Sexual activity: Yes    Partners: Male    Birth control/protection: Post-menopausal  Other Topics Concern   Not on file  Social History Narrative   7 hours of sleep per night   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    Lives with her husband.    Has 2 children in college.   G2P2   nneg ets  fa   Social Drivers of Corporate investment banker Strain: Low Risk  (08/27/2024)   Overall Financial Resource Strain (CARDIA)    Difficulty of Paying Living Expenses: Not hard at all  Food Insecurity: No Food Insecurity (08/27/2024)   Hunger Vital Sign    Worried About Running Out of Food in the Last Year: Never true    Ran Out of Food in the Last Year: Never true  Transportation Needs: No Transportation Needs (08/27/2024)   PRAPARE - Administrator, Civil Service (Medical): No    Lack of Transportation (Non-Medical): No  Physical Activity: Sufficiently Active (08/27/2024)   Exercise Vital Sign    Days of Exercise per Week: 6 days    Minutes of Exercise per Session: 60 min  Stress: No Stress Concern Present (08/27/2024)   [employer] of Occupational Health - Occupational Stress Questionnaire    Feeling of Stress: Not at all  Social Connections: Socially Integrated (08/27/2024)   Social  Connection and Isolation Panel    Frequency of Communication with Friends and Family: More than three times a week    Frequency of Social Gatherings with Friends and Family: More than three times a week    Attends Religious Services: More than 4 times per year    Active Member of [employer] or Organizations: Yes    Attends Engineer, structural: More than 4 times per year    Marital Status: Married    Outpatient Medications Prior to Visit  Medication Sig Dispense Refill   Cholecalciferol (VITAMIN D  PO) Take 1 tablet by mouth daily.     ibuprofen (ADVIL) 200 MG tablet ibuprofen  prn     triamcinolone cream (KENALOG) 0.1 % SMARTSIG:1 Application Topical 2-3 Times Daily     No facility-administered medications prior to visit.     EXAM:  LMP 12/26/2009 (Approximate)   There is no height or weight on file to calculate BMI. Wt Readings from Last 3 Encounters:  09/01/21 158 lb 3.2 oz (71.8 kg)  06/09/20 144 lb 3.2 oz (65.4 kg)  11/27/19 156 lb 6.4 oz (70.9 kg)    Physical Exam: Vital signs reviewed HZW:Uypd is a well-developed well-nourished alert cooperative    who appearsr stated age in no acute distress.  HEENT: normocephalic atraumatic , Eyes: PERRL EOM's full, conjunctiva clear, Nares: paten,t no deformity discharge or tenderness., Ears: no deformity EAC's clear TMs with normal landmarks. Mouth: clear OP, no lesions, edema.  Moist mucous membranes. Dentition in adequate repair. NECK: supple without masses, thyromegaly or bruits. CHEST/PULM:  Clear to auscultation and percussion breath sounds equal no wheeze , rales or rhonchi. No chest wall deformities or tenderness. Breast: normal by inspection . No dimpling, discharge, masses, tenderness or discharge . CV: PMI is nondisplaced, S1 S2 no gallops, murmurs, rubs. Peripheral pulses are full without delay.No JVD .  ABDOMEN: Bowel sounds normal nontender  No guard or rebound, no hepato splenomegal no CVA tenderness.  No  hernia. Extremtities:  No clubbing cyanosis or edema, no acute joint swelling or redness no focal atrophy NEURO:  Oriented x3, cranial nerves 3-12 appear to be intact, no obvious focal weakness,gait within normal limits no abnormal reflexes or asymmetrical SKIN: No acute rashes normal turgor, color, no bruising or petechiae. PSYCH: Oriented, good eye contact, no obvious depression anxiety, cognition and judgment appear normal. LN: no cervical axillary  adenopathy  Lab Results  Component Value Date   WBC 6.3 09/02/2021   HGB 13.0 09/02/2021  HCT 38.9 09/02/2021   PLT 263.0 09/02/2021   GLUCOSE 83 09/02/2021   CHOL 168 09/02/2021   TRIG 66.0 09/02/2021   HDL 67.30 09/02/2021   LDLCALC 87 09/02/2021   ALT 14 09/02/2021   AST 17 09/02/2021   NA 141 09/02/2021   K 4.6 09/02/2021   CL 106 09/02/2021   CREATININE 0.82 09/02/2021   BUN 25 (H) 09/02/2021   CO2 28 09/02/2021   TSH 3.49 09/02/2021   HGBA1C 5.5 09/02/2021    BP Readings from Last 3 Encounters:  09/01/21 110/66  06/09/20 126/74  11/27/19 120/64    Lab rplan reviewed with patient   ASSESSMENT AND PLAN:  Discussed the following assessment and plan:  No diagnosis found. No follow-ups on file.  Patient Care Team: Aurie Harroun, Apolinar POUR, MD as PCP - General (Internal Medicine) Nada Macintosh, FNP as Nurse Practitioner (Nurse Practitioner) Kristie Lamprey, MD as Consulting Physician (Gastroenterology) Livingston Rigg, MD as Consulting Physician (Dermatology) There are no Patient Instructions on file for this visit.  Mendel Binsfeld K. Jerzy Crotteau M.D.

## 2024-08-29 ENCOUNTER — Ambulatory Visit: Payer: Self-pay | Admitting: Internal Medicine

## 2024-08-29 ENCOUNTER — Encounter: Payer: Self-pay | Admitting: Internal Medicine

## 2024-08-29 ENCOUNTER — Ambulatory Visit (INDEPENDENT_AMBULATORY_CARE_PROVIDER_SITE_OTHER): Admitting: Internal Medicine

## 2024-08-29 VITALS — BP 110/74 | HR 70 | Temp 98.1°F | Ht 66.75 in | Wt 180.4 lb

## 2024-08-29 DIAGNOSIS — Z23 Encounter for immunization: Secondary | ICD-10-CM | POA: Diagnosis not present

## 2024-08-29 DIAGNOSIS — Z8639 Personal history of other endocrine, nutritional and metabolic disease: Secondary | ICD-10-CM

## 2024-08-29 DIAGNOSIS — Z0000 Encounter for general adult medical examination without abnormal findings: Secondary | ICD-10-CM | POA: Diagnosis not present

## 2024-08-29 DIAGNOSIS — Z13220 Encounter for screening for lipoid disorders: Secondary | ICD-10-CM

## 2024-08-29 LAB — LIPID PANEL
Cholesterol: 172 mg/dL (ref 0–200)
HDL: 66.8 mg/dL (ref >39.00)
LDL Cholesterol: 96 mg/dL (ref 0–99)
NonHDL: 105.49
Total CHOL/HDL Ratio: 3
Triglycerides: 47 mg/dL (ref 0.0–149.0)
VLDL: 9.4 mg/dL (ref 0.0–40.0)

## 2024-08-29 LAB — CBC WITH DIFFERENTIAL/PLATELET
Basophils Absolute: 0 K/uL (ref 0.0–0.1)
Basophils Relative: 0.6 % (ref 0.0–3.0)
Eosinophils Absolute: 0.1 K/uL (ref 0.0–0.7)
Eosinophils Relative: 2.1 % (ref 0.0–5.0)
HCT: 39.6 % (ref 36.0–46.0)
Hemoglobin: 13.3 g/dL (ref 12.0–15.0)
Lymphocytes Relative: 24 % (ref 12.0–46.0)
Lymphs Abs: 1.5 K/uL (ref 0.7–4.0)
MCHC: 33.5 g/dL (ref 30.0–36.0)
MCV: 91.6 fl (ref 78.0–100.0)
Monocytes Absolute: 0.6 K/uL (ref 0.1–1.0)
Monocytes Relative: 8.9 % (ref 3.0–12.0)
Neutro Abs: 4 K/uL (ref 1.4–7.7)
Neutrophils Relative %: 64.4 % (ref 43.0–77.0)
Platelets: 309 K/uL (ref 150.0–400.0)
RBC: 4.33 Mil/uL (ref 3.87–5.11)
RDW: 13.4 % (ref 11.5–15.5)
WBC: 6.3 K/uL (ref 4.0–10.5)

## 2024-08-29 LAB — COMPREHENSIVE METABOLIC PANEL WITH GFR
ALT: 17 U/L (ref 0–35)
AST: 19 U/L (ref 0–37)
Albumin: 4.5 g/dL (ref 3.5–5.2)
Alkaline Phosphatase: 91 U/L (ref 39–117)
BUN: 26 mg/dL — ABNORMAL HIGH (ref 6–23)
CO2: 28 meq/L (ref 19–32)
Calcium: 9.1 mg/dL (ref 8.4–10.5)
Chloride: 106 meq/L (ref 96–112)
Creatinine, Ser: 0.81 mg/dL (ref 0.40–1.20)
GFR: 77.6 mL/min (ref >60.00)
Glucose, Bld: 89 mg/dL (ref 70–99)
Potassium: 4.5 meq/L (ref 3.5–5.1)
Sodium: 142 meq/L (ref 135–145)
Total Bilirubin: 0.5 mg/dL (ref 0.2–1.2)
Total Protein: 7.1 g/dL (ref 6.0–8.3)

## 2024-08-29 LAB — TSH: TSH: 2.79 u[IU]/mL (ref 0.35–5.50)

## 2024-08-29 LAB — HEMOGLOBIN A1C: Hgb A1c MFr Bld: 5.8 % (ref 4.6–6.5)

## 2024-08-29 LAB — VITAMIN D 25 HYDROXY (VIT D DEFICIENCY, FRACTURES): VITD: 43.06 ng/mL (ref 30.00–100.00)

## 2024-08-29 NOTE — Patient Instructions (Signed)
 Good to see you today . Lab update . Continue lifestyle intervention healthy eating and exercise .  Resistance exercise to maintain muscle mass.

## 2024-08-29 NOTE — Progress Notes (Signed)
 Results are in range  or normal  Except bun which is probably  related to fasting state.  Creatinine shows normal egfr. Vit d level is in ok range . Continue lifestyle intervention healthy eating and exercise .

## 2024-09-12 LAB — HM MAMMOGRAPHY

## 2024-09-17 ENCOUNTER — Encounter: Payer: Self-pay | Admitting: Internal Medicine

## 2024-10-10 ENCOUNTER — Encounter: Payer: Self-pay | Admitting: Obstetrics and Gynecology

## 2024-10-10 ENCOUNTER — Other Ambulatory Visit (HOSPITAL_COMMUNITY)
Admission: RE | Admit: 2024-10-10 | Discharge: 2024-10-10 | Disposition: A | Source: Ambulatory Visit | Attending: Obstetrics and Gynecology | Admitting: Obstetrics and Gynecology

## 2024-10-10 ENCOUNTER — Ambulatory Visit (INDEPENDENT_AMBULATORY_CARE_PROVIDER_SITE_OTHER): Admitting: Obstetrics and Gynecology

## 2024-10-10 VITALS — BP 110/76 | HR 66 | Temp 97.8°F | Ht 67.5 in | Wt 177.0 lb

## 2024-10-10 DIAGNOSIS — Z01419 Encounter for gynecological examination (general) (routine) without abnormal findings: Secondary | ICD-10-CM | POA: Insufficient documentation

## 2024-10-10 DIAGNOSIS — Z124 Encounter for screening for malignant neoplasm of cervix: Secondary | ICD-10-CM | POA: Diagnosis present

## 2024-10-10 DIAGNOSIS — N3941 Urge incontinence: Secondary | ICD-10-CM

## 2024-10-10 DIAGNOSIS — Z1331 Encounter for screening for depression: Secondary | ICD-10-CM

## 2024-10-10 NOTE — Assessment & Plan Note (Signed)
 Cervical cancer screening performed according to ASCCP guidelines. Encouraged annual mammogram screening Colonoscopy UTD DXA, consider early given hx of hyperparathyroidism, 2016, now corrected. Labs and immunizations with her primary Encouraged safe sexual practices as indicated Encouraged healthy lifestyle practices with diet and exercise For patients under 50-63yo, I recommend 1200mg  calcium daily and 600IU of vitamin D  daily.

## 2024-10-10 NOTE — Patient Instructions (Signed)
 For patients under 50-63yo, I recommend 1200mg  calcium  daily and 600IU of vitamin D daily. For patients over 63yo, I recommend 1200mg  calcium  daily and 800IU of vitamin D daily.  Health Maintenance, Female Adopting a healthy lifestyle and getting preventive care are important in promoting health and wellness. Ask your health care provider about: The right schedule for you to have regular tests and exams. Things you can do on your own to prevent diseases and keep yourself healthy. What should I know about diet, weight, and exercise? Eat a healthy diet  Eat a diet that includes plenty of vegetables, fruits, low-fat dairy products, and lean protein. Do not eat a lot of foods that are high in solid fats, added sugars, or sodium. Maintain a healthy weight Body mass index (BMI) is used to identify weight problems. It estimates body fat based on height and weight. Your health care provider can help determine your BMI and help you achieve or maintain a healthy weight. Get regular exercise Get regular exercise. This is one of the most important things you can do for your health. Most adults should: Exercise for at least 150 minutes each week. The exercise should increase your heart rate and make you sweat (moderate-intensity exercise). Do strengthening exercises at least twice a week. This is in addition to the moderate-intensity exercise. Spend less time sitting. Even light physical activity can be beneficial. Watch cholesterol and blood lipids Have your blood tested for lipids and cholesterol at 63 years of age, then have this test every 5 years. Have your cholesterol levels checked more often if: Your lipid or cholesterol levels are high. You are older than 63 years of age. You are at high risk for heart disease. What should I know about cancer screening? Depending on your health history and family history, you may need to have cancer screening at various ages. This may include screening  for: Breast cancer. Cervical cancer. Colorectal cancer. Skin cancer. Lung cancer. What should I know about heart disease, diabetes, and high blood pressure? Blood pressure and heart disease High blood pressure causes heart disease and increases the risk of stroke. This is more likely to develop in people who have high blood pressure readings or are overweight. Have your blood pressure checked: Every 3-5 years if you are 25-57 years of age. Every year if you are 24 years old or older. Diabetes Have regular diabetes screenings. This checks your fasting blood sugar level. Have the screening done: Once every three years after age 62 if you are at a normal weight and have a low risk for diabetes. More often and at a younger age if you are overweight or have a high risk for diabetes. What should I know about preventing infection? Hepatitis B If you have a higher risk for hepatitis B, you should be screened for this virus. Talk with your health care provider to find out if you are at risk for hepatitis B infection. Hepatitis C Testing is recommended for: Everyone born from [birth year] through 1965. Anyone with known risk factors for hepatitis C. Sexually transmitted infections (STIs) Get screened for STIs, including gonorrhea and chlamydia, if: You are sexually active and are younger than 63 years of age. You are older than 63 years of age and your health care provider tells you that you are at risk for this type of infection. Your sexual activity has changed since you were last screened, and you are at increased risk for chlamydia or gonorrhea. Ask your health care provider if  you are at risk. Ask your health care provider about whether you are at high risk for HIV. Your health care provider may recommend a prescription medicine to help prevent HIV infection. If you choose to take medicine to prevent HIV, you should first get tested for HIV. You should then be tested every 3 months for as long as you  are taking the medicine. Osteoporosis and menopause Osteoporosis is a disease in which the bones lose minerals and strength with aging. This can result in bone fractures. If you are 72 years old or older, or if you are at risk for osteoporosis and fractures, ask your health care provider if you should: Be screened for bone loss. Take a calcium  or vitamin D supplement to lower your risk of fractures. Be given hormone replacement therapy (HRT) to treat symptoms of menopause. Follow these instructions at home: Alcohol use Do not drink alcohol if: Your health care provider tells you not to drink. You are pregnant, may be pregnant, or are planning to become pregnant. If you drink alcohol: Limit how much you have to: 0-1 drink a day. Know how much alcohol is in your drink. In the [place] one drink equals one 12 oz bottle of beer (355 mL), one 5 oz glass of wine (148 mL), or one 1 oz glass of hard liquor (44 mL). Lifestyle Do not use any products that contain nicotine or tobacco. These products include cigarettes, chewing tobacco, and vaping devices, such as e-cigarettes. If you need help quitting, ask your health care provider. Do not use street drugs. Do not share needles. Ask your health care provider for help if you need support or information about quitting drugs. General instructions Schedule regular health, dental, and eye exams. Stay current with your vaccines. Tell your health care provider if: You often feel depressed. You have ever been abused or do not feel safe at home. Summary Adopting a healthy lifestyle and getting preventive care are important in promoting health and wellness. Follow your health care provider's instructions about healthy diet, exercising, and getting tested or screened for diseases. Follow your health care provider's instructions on monitoring your cholesterol and blood pressure. This information is not intended to replace advice given to you by your health  care provider. Make sure you discuss any questions you have with your health care provider. Document Revised: 05/03/2021 Document Reviewed: 05/03/2021 Elsevier Patient Education  2024 [employer].

## 2024-10-10 NOTE — Progress Notes (Signed)
 63 y.o. G38P2002 female here for annual exam. 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 Son and daughter. PCP: Charlett Apolinar POUR, MD   Patient's last menstrual period was 12/26/2009 (approximate).   She reports mild urge incontinence. Rare. Also vaginal dryness, well managed with lubricant. S/P removal of parathyroid  gland on left X 1, 2016.  History of hypercalcemia and hyperparathyroid    Urine sample provided: No  Abnormal bleeding: none Pelvic discharge or pain: none Breast mass, nipple discharge or skin changes : none  Sexually active: Yes Birth control: None Last PAP: 01/15/2015 WNL neg HR HPV  Last mammogram: 09/12/24 birads 1 neg, density A Last colonoscopy: 12/09/14, hyperplastic polyp, repeat in 10 years    Exercising: Yes, tennis and yoga 2-4 days a week, personal trainer once a week and walking daily Smoker: No  Flowsheet Row Office Visit from 10/10/2024 in [facility]  PHQ-2 Total Score 0    Flowsheet Row Office Visit from 09/01/2021 in [facility] [place]  PHQ-9 Total Score 0     GYN HISTORY: None  OB History  Gravida Para Term Preterm AB Living  2 2 2  0 0 2  SAB IAB Ectopic Multiple Live Births  0 0 0 0 2    # Outcome Date GA Lbr Len/2nd Weight Sex Type Anes PTL Lv  2 Term 06/06/96 [redacted]w[redacted]d   F Vag-Spont EPI N LIV  1 Term 11/20/93 [redacted]w[redacted]d   M Vag-Spont EPI N LIV   Past Medical History:  Diagnosis Date   Abnormal LFTs 02/04/2015   per dr Kristie.  repeat with hep c screen      Colon polyps    History of shingles    Hx of varicella    Hypercalcemia 02/04/2015   x 1 with polyuria dry mouth check pth and tsh      Hyperparathyroidism 02/09/2015   Hypercalcemia and elevaetd pth    polyuriasx     Idiopathic parathyroidism    Nasal sore 02/04/2015   poss bacterial bactroban  may need antibiotic if not healting or as indicated     Seasonal  allergies    Past Surgical History:  Procedure Laterality Date   PARATHYROIDECTOMY N/A 11/23/2015   Procedure: LEFT INFERIOR PARATHYROIDECTOMY;  Surgeon: Krystal Spinner, MD;  Location: [facility] OR;  Service: General;  Laterality: N/A;   TOE SURGERY Left 1996   pin in little toe   TONSILLECTOMY  2019   Current Outpatient Medications on File Prior to Visit  Medication Sig Dispense Refill   ibuprofen (ADVIL) 200 MG tablet ibuprofen  prn (Patient taking differently: As needed)     No current facility-administered medications on file prior to visit.   Social History   Socioeconomic History   Marital status: Married    Spouse name: Not on file   Number of children: 2   Years of education: Not on file   Highest education level: Bachelor's degree (e.g., BA, AB, BS)  Occupational History   Occupation: cpa  Tobacco Use   Smoking status: Never   Smokeless tobacco: Never  Vaping Use   Vaping status: Never Used  Substance and Sexual Activity   Alcohol use: Yes    Alcohol/week: 2.0 - 3.0 standard drinks of alcohol    Types: 2 - 3 Cans of beer per week    Comment: occ   Drug use: No   Sexual activity: Yes  Partners: Male    Birth control/protection: Post-menopausal  Other Topics Concern   Not on file  Social History Narrative   7 hours of sleep per night   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    Lives with her husband.    Has 2 children in college.   G2P2   nneg ets fa   Social Drivers of Corporate investment banker Strain: Low Risk  (08/27/2024)   Overall Financial Resource Strain (CARDIA)    Difficulty of Paying Living Expenses: Not hard at all  Food Insecurity: No Food Insecurity (08/27/2024)   Hunger Vital Sign    Worried About Running Out of Food in the Last Year: Never true    Ran Out of Food in the Last Year: Never true  Transportation Needs: No Transportation Needs (08/27/2024)   PRAPARE - Administrator, Civil Service (Medical): No    Lack of  Transportation (Non-Medical): No  Physical Activity: Sufficiently Active (08/27/2024)   Exercise Vital Sign    Days of Exercise per Week: 6 days    Minutes of Exercise per Session: 60 min  Stress: No Stress Concern Present (08/27/2024)   [employer] of Occupational Health - Occupational Stress Questionnaire    Feeling of Stress: Not at all  Social Connections: Socially Integrated (08/27/2024)   Social Connection and Isolation Panel    Frequency of Communication with Friends and Family: More than three times a week    Frequency of Social Gatherings with Friends and Family: More than three times a week    Attends Religious Services: More than 4 times per year    Active Member of [employer] or Organizations: Yes    Attends Engineer, structural: More than 4 times per year    Marital Status: Married  Catering manager Violence: Not on file   Family History  Problem Relation Age of Onset   COPD Mother        smoker   Heart disease Father        CABG x 3   Diabetes Maternal Grandfather        type 1   No Known Allergies   PE Today's Vitals   10/10/24 0952  BP: 110/76  Pulse: 66  Temp: 97.8 F (36.6 C)  TempSrc: Oral  SpO2: 99%  Weight: 177 lb (80.3 kg)  Height: 5' 7.5 (1.715 m)   Body mass index is 27.31 kg/m.  Physical Exam Vitals reviewed. Exam conducted with a chaperone present.  Constitutional:      General: She is not in acute distress.    Appearance: Normal appearance.  HENT:     Head: Normocephalic and atraumatic.     Nose: Nose normal.  Eyes:     Extraocular Movements: Extraocular movements intact.     Conjunctiva/sclera: Conjunctivae normal.  Neck:     Thyroid : No thyroid  mass, thyromegaly or thyroid  tenderness.  Pulmonary:     Effort: Pulmonary effort is normal.  Chest:     Chest wall: No mass or tenderness.  Breasts:    Right: Normal. No swelling, mass, nipple discharge, skin change or tenderness.     Left: Normal. No swelling, mass, nipple  discharge, skin change or tenderness.  Abdominal:     General: There is no distension.     Palpations: Abdomen is soft.     Tenderness: There is no abdominal tenderness.  Genitourinary:    General: Normal vulva.  Exam position: Lithotomy position.     Urethra: No prolapse.     Vagina: Normal. No vaginal discharge or bleeding.     Cervix: Normal. No lesion.     Uterus: Normal. Not enlarged and not tender.      Adnexa: Right adnexa normal and left adnexa normal.     Comments: Kegel 1/5 with quick fatigue Musculoskeletal:        General: Normal range of motion.     Cervical back: Normal range of motion.  Lymphadenopathy:     Upper Body:     Right upper body: No axillary adenopathy.     Left upper body: No axillary adenopathy.     Lower Body: No right inguinal adenopathy. No left inguinal adenopathy.  Skin:    General: Skin is warm and dry.  Neurological:     General: No focal deficit present.     Mental Status: She is alert.  Psychiatric:        Mood and Affect: Mood normal.        Behavior: Behavior normal.      Assessment and Plan:        Well woman exam with routine gynecological exam Assessment & Plan: Cervical cancer screening performed according to ASCCP guidelines. Encouraged annual mammogram screening Colonoscopy UTD DXA, consider early given hx of hyperparathyroidism, 2016, now corrected. Labs and immunizations with her primary Encouraged safe sexual practices as indicated Encouraged healthy lifestyle practices with diet and exercise For patients under 50-70yo, I recommend 1200mg  calcium daily and 600IU of vitamin D  daily.    Cervical cancer screening -     Cytology - PAP  Screening for depression  Urge incontinence  Encouraged kegels for mild sx.  Vera LULLA Pa, MD

## 2024-10-15 LAB — CYTOLOGY - PAP
Comment: NEGATIVE
Diagnosis: NEGATIVE
High risk HPV: NEGATIVE

## 2024-10-16 ENCOUNTER — Ambulatory Visit: Payer: Self-pay | Admitting: Obstetrics and Gynecology

## 2025-01-01 LAB — HM COLONOSCOPY
